# Patient Record
Sex: Female | Born: 1945 | Race: Black or African American | Hispanic: No | Smoking: Never smoker
Health system: Southern US, Community
[De-identification: ages and names within clinical notes are randomized; demographics above are authoritative.]

## PROBLEM LIST (undated history)

## (undated) DIAGNOSIS — E785 Hyperlipidemia, unspecified: Secondary | ICD-10-CM

## (undated) DIAGNOSIS — M1990 Unspecified osteoarthritis, unspecified site: Secondary | ICD-10-CM

## (undated) DIAGNOSIS — D72819 Decreased white blood cell count, unspecified: Principal | ICD-10-CM

## (undated) HISTORY — DX: Hyperlipidemia, unspecified: E78.5

## (undated) HISTORY — PX: COLONOSCOPY: SHX174

## (undated) HISTORY — DX: Decreased white blood cell count, unspecified: D72.819

---

## 2007-05-24 ENCOUNTER — Other Ambulatory Visit: Admission: RE | Admit: 2007-05-24 | Discharge: 2007-05-24 | Payer: Self-pay | Admitting: Radiology

## 2014-05-23 ENCOUNTER — Encounter: Payer: Self-pay | Admitting: Hematology and Oncology

## 2014-05-23 ENCOUNTER — Ambulatory Visit (HOSPITAL_BASED_OUTPATIENT_CLINIC_OR_DEPARTMENT_OTHER): Payer: Medicare Other | Admitting: Hematology and Oncology

## 2014-05-23 ENCOUNTER — Ambulatory Visit: Payer: Medicare Other

## 2014-05-23 ENCOUNTER — Telehealth: Payer: Self-pay | Admitting: Hematology and Oncology

## 2014-05-23 ENCOUNTER — Ambulatory Visit (HOSPITAL_BASED_OUTPATIENT_CLINIC_OR_DEPARTMENT_OTHER): Payer: Medicare Other

## 2014-05-23 VITALS — BP 146/75 | HR 97 | Temp 98.5°F | Resp 20 | Ht 66.0 in | Wt 136.9 lb

## 2014-05-23 DIAGNOSIS — D72819 Decreased white blood cell count, unspecified: Secondary | ICD-10-CM

## 2014-05-23 HISTORY — DX: Decreased white blood cell count, unspecified: D72.819

## 2014-05-23 LAB — CBC WITH DIFFERENTIAL/PLATELET
BASO%: 2.5 % — AB (ref 0.0–2.0)
Basophils Absolute: 0.1 10*3/uL (ref 0.0–0.1)
EOS%: 2.4 % (ref 0.0–7.0)
Eosinophils Absolute: 0.1 10*3/uL (ref 0.0–0.5)
HEMATOCRIT: 37.3 % (ref 34.8–46.6)
HGB: 12 g/dL (ref 11.6–15.9)
LYMPH%: 38.4 % (ref 14.0–49.7)
MCH: 28.5 pg (ref 25.1–34.0)
MCHC: 32.1 g/dL (ref 31.5–36.0)
MCV: 89 fL (ref 79.5–101.0)
MONO#: 0.4 10*3/uL (ref 0.1–0.9)
MONO%: 13.7 % (ref 0.0–14.0)
NEUT#: 1.2 10*3/uL — ABNORMAL LOW (ref 1.5–6.5)
NEUT%: 43 % (ref 38.4–76.8)
PLATELETS: 313 10*3/uL (ref 145–400)
RBC: 4.2 10*6/uL (ref 3.70–5.45)
RDW: 14.9 % — ABNORMAL HIGH (ref 11.2–14.5)
WBC: 2.7 10*3/uL — ABNORMAL LOW (ref 3.9–10.3)
lymph#: 1 10*3/uL (ref 0.9–3.3)

## 2014-05-23 LAB — VITAMIN B12: Vitamin B-12: 420 pg/mL (ref 211–911)

## 2014-05-23 LAB — MORPHOLOGY: PLT EST: ADEQUATE

## 2014-05-23 LAB — SEDIMENTATION RATE: Sed Rate: 1 mm/hr (ref 0–22)

## 2014-05-23 LAB — HIV ANTIBODY (ROUTINE TESTING W REFLEX): HIV: NONREACTIVE

## 2014-05-23 LAB — RHEUMATOID FACTOR: Rhuematoid fact SerPl-aCnc: <10 IU/mL (ref <=14)

## 2014-05-23 LAB — CHCC SMEAR

## 2014-05-23 NOTE — Progress Notes (Signed)
[facility] NOTE  Patient Care Team: [name], MD as PCP - General (Family Medicine) [name], MD as Referring Physician (Family Medicine) Heath Lark, MD as Consulting Physician (Hematology and Oncology)  CHIEF COMPLAINTS/PURPOSE OF CONSULTATION:  Chronic leukopenia  HISTORY OF PRESENTING ILLNESS:  Samantha Elliott 69 y.o. female is here because of low WBC.  She was found to have abnormal CBC from routine blood work. She brought with her some historical laboratory data.  On 09/25/1999, total cell count was 6.5.  On 01/08/2006, total white blood cell count was 4.4  On 06/01/2007, total white blood cell count was 3.8. In November 2014, total white blood cell count was 3.2 On 05/11/2014, total blood cell count was 2.9 She denies recent infection. The last prescription antibiotics was more than 3 months ago There were no reported symptoms of sinus congestion, cough, urinary frequency/urgency or dysuria, diarrhea, joint swelling/pain or abnormal skin rash. 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  She had no prior history or diagnosis of cancer. Her age appropriate screening programs are up-to-date. The patient has no prior diagnosis of autoimmune disease and was not prescribed corticosteroids related products.  MEDICAL HISTORY:  Past Medical History  Diagnosis Date  . Hyperlipidemia   . Leukopenia 05/23/2014    SURGICAL HISTORY: History reviewed. No pertinent past surgical history.  SOCIAL HISTORY: History   Social History  . Marital Status: Unknown    Spouse Name: N/A    Number of Children: N/A  . Years of Education: N/A   Occupational History  . Not on file.   Social History Main Topics  . Smoking status: Never Smoker   . Smokeless tobacco: Never Used  . Alcohol Use: No  . Drug Use: No  . Sexual Activity: Not on file   Other Topics Concern  . Not on file   Social History Narrative  . No  narrative on file    FAMILY HISTORY: Family History  Problem Relation Age of Onset  . Cancer Sister     breast & uterine ca    ALLERGIES:  has No Known Allergies.  MEDICATIONS:  Current Outpatient Prescriptions  Medication Sig Dispense Refill  . aspirin 81 MG tablet Take 81 mg by mouth daily.      . Calcium-Magnesium-Vitamin D (CALCIUM 500 PO) Take 500 mg by mouth daily. Gummy, 2 daily      . Cats Claw 1000 MG CAPS Take 500 mg by mouth daily.      . cholecalciferol (VITAMIN D) 1000 UNITS tablet Take 2,000 Units by mouth daily.      Marland Kitchen co-enzyme Q-10 30 MG capsule Take 100 mg by mouth 2 (two) times daily.      . fenofibrate (TRICOR) 145 MG tablet Take 145 mg by mouth daily.      . Multiple Vitamin (MULTIVITAMIN) tablet Take 2 tablets by mouth daily. Gummy      . niacin (NIASPAN) 500 MG CR tablet Take 500 mg by mouth daily.      Marland Kitchen omega-3 acid ethyl esters (LOVAZA) 1 G capsule Take by mouth daily.       No current facility-administered medications for this visit.    REVIEW OF SYSTEMS:   Constitutional: Denies fevers, chills or abnormal night sweats Eyes: Denies blurriness of vision, double vision or watery eyes Ears, nose, mouth, throat, and face: Denies mucositis or sore throat Respiratory: Denies cough, dyspnea or wheezes Cardiovascular: Denies palpitation, chest discomfort  or lower extremity swelling Gastrointestinal:  Denies nausea, heartburn or change in bowel habits Skin: Denies abnormal skin rashes Lymphatics: Denies new lymphadenopathy or easy bruising Neurological:Denies numbness, tingling or new weaknesses Behavioral/Psych: Mood is stable, no new changes  All other systems were reviewed with the patient and are negative.  PHYSICAL EXAMINATION: ECOG PERFORMANCE STATUS: 0 - Asymptomatic  Filed Vitals:   05/23/14 1106  BP: 146/75  Pulse: 97  Temp: 98.5 F (36.9 C)  Resp: 20   Filed Weights   05/23/14 1106  Weight: 136 lb 14.4 oz (62.097 kg)     GENERAL:alert, no distress and comfortable SKIN: skin color, texture, turgor are normal, no rashes or significant lesions EYES: normal, conjunctiva are pink and non-injected, sclera clear OROPHARYNX:no exudate, no erythema and lips, buccal mucosa, and tongue normal  NECK: supple, thyroid normal size, non-tender, without nodularity LYMPH:  no palpable lymphadenopathy in the cervical, axillary or inguinal LUNGS: clear to auscultation and percussion with normal breathing effort HEART: regular rate & rhythm and no murmurs and no lower extremity edema ABDOMEN:abdomen soft, non-tender and normal bowel sounds Musculoskeletal:no cyanosis of digits and no clubbing  PSYCH: alert & oriented x 3 with fluent speech NEURO: no focal motor/sensory deficits  LABORATORY DATA:  I have reviewed the data as listed Recent Results (from the past 2160 hour(s))  HIV ANTIBODY (ROUTINE TESTING)     Status: None   Collection Time    05/23/14 12:04 PM      Result Value Ref Range   HIV 1&2 Ab, 4th Generation NONREACTIVE  NONREACTIVE   Comment:  A NONREACTIVE HIV Ag/Ab result does not exclude HIV infection sincethe time frame for seroconversion is variable. If acute HIV infectionis suspected, a HIV-1 RNA Qualitative TMA test is recommended. HIV-1/2 Antibody Diff         Not indicated.HIV-1 RNA,      Qual TMA           Not indicated. PLEASE NOTE: This information has been disclosed to you from [place] confidentiality may be protected by state law. If your staterequires such protection, then the state law prohibits you from [facility] further      disclosure of the information without the specific writtenconsent of the person to whom it pertains, or as otherwise permittedby law. A general authorization for the release of medical or otherinformation is NOT sufficient for this purpose. The      performance of this assay has not been clinically validated inpatients less than 22 years old.  SEDIMENTATION RATE      Status: None   Collection Time    05/23/14 12:04 PM      Result Value Ref Range   Sed Rate 1  0 - 22 mm/hr  RHEUMATOID FACTOR     Status: None   Collection Time    05/23/14 12:04 PM      Result Value Ref Range   Rheumatoid Factor <10  <=14 IU/mL   Comment:                           Interpretive Table                    Low Positive: 15 - 41 IU/mL                    High Positive:  >= 42 IU/mL  In addition to the RF result, and clinical symptoms including joint involvement, the  2010 ACR Classification      Criteria for scoring/diagnosing Rheumatoid Arthritis include the results of the following tests:  CRP (56979), ESR (15010), and CCP (APCA) (48016). [email or web address].org/practice/clinical/classification/ra/ra_2010.asp  CBC WITH DIFFERENTIAL     Status: Abnormal   Collection Time    05/23/14 12:05 PM      Result Value Ref Range   WBC 2.7 (*) 3.9 - 10.3 10e3/uL   NEUT# 1.2 (*) 1.5 - 6.5 10e3/uL   HGB 12.0  11.6 - 15.9 g/dL   HCT 37.3  34.8 - 46.6 %   Platelets 313  145 - 400 10e3/uL   MCV 89.0  79.5 - 101.0 fL   MCH 28.5  25.1 - 34.0 pg   MCHC 32.1  31.5 - 36.0 g/dL   RBC 4.20  3.70 - 5.45 10e6/uL   RDW 14.9 (*) 11.2 - 14.5 %   lymph# 1.0  0.9 - 3.3 10e3/uL   MONO# 0.4  0.1 - 0.9 10e3/uL   Eosinophils Absolute 0.1  0.0 - 0.5 10e3/uL   Basophils Absolute 0.1  0.0 - 0.1 10e3/uL   NEUT% 43.0  38.4 - 76.8 %   LYMPH% 38.4  14.0 - 49.7 %   MONO% 13.7  0.0 - 14.0 %   EOS% 2.4  0.0 - 7.0 %   BASO% 2.5 (*) 0.0 - 2.0 %  [facility] SMEAR     Status: None   Collection Time    05/23/14 12:05 PM      Result Value Ref Range   Smear Result Smear Available    MORPHOLOGY     Status: None   Collection Time    05/23/14 12:05 PM      Result Value Ref Range   Ovalocytes Moderate  Negative   Helmet Cell Few  Negative   White Cell Comments C/W auto diff     PLT EST Adequate  Adequate   Platelet Morphology Large Platelets  Within Normal Limits    ASSESSMENT & PLAN Leukopenia Cause is unknown. I  recommend further workup to exclude autoimmune disease, infectious disease and nutritional deficiency. The patient is symptomatic. I will see her back next week for further assessment and plan.

## 2014-05-23 NOTE — Progress Notes (Signed)
Checked in new patient with no financial issues prior to seeing the dr. She has appt card and [place] been out of the country. °

## 2014-05-23 NOTE — Telephone Encounter (Signed)
gv adn printed appt sched and avs for pt for Aug...sent pt to lab °

## 2014-05-23 NOTE — Assessment & Plan Note (Signed)
Cause is unknown. I recommend further workup to exclude autoimmune disease, infectious disease and nutritional deficiency. The patient is symptomatic. I will see her back next week for further assessment and plan.

## 2014-05-29 ENCOUNTER — Encounter: Payer: Self-pay | Admitting: Hematology and Oncology

## 2014-05-29 ENCOUNTER — Telehealth: Payer: Self-pay | Admitting: *Deleted

## 2014-05-29 ENCOUNTER — Ambulatory Visit (HOSPITAL_BASED_OUTPATIENT_CLINIC_OR_DEPARTMENT_OTHER): Payer: Medicare Other | Admitting: Hematology and Oncology

## 2014-05-29 VITALS — BP 143/77 | HR 77 | Temp 98.4°F | Resp 18 | Ht 66.0 in | Wt 138.0 lb

## 2014-05-29 DIAGNOSIS — D72819 Decreased white blood cell count, unspecified: Secondary | ICD-10-CM

## 2014-05-29 NOTE — Telephone Encounter (Signed)
Notified patient of [facility] on 9/8 0700, Dr Bertis Ruddy 9/17 1100.

## 2014-05-29 NOTE — Assessment & Plan Note (Signed)
The cause is unknown. I discussed with her and her daughter over the telephone about the risks, benefits, alternatives to bone marrow aspirate and biopsy. I felt that she would need bone marrow biopsy in the near future due to progressive leukopenia. I discussed with them in great detail to procedure and she ultimately wants to proceed for biopsy under sedation.

## 2014-05-29 NOTE — Progress Notes (Signed)
[facility] OFFICE PROGRESS NOTE  Samantha Savage, MD SUMMARY OF HEMATOLOGIC HISTORY:  She was found to have abnormal CBC from routine blood work. She brought with her some historical laboratory data.  On 09/25/1999, total cell count was 6.5.  On 01/08/2006, total white blood cell count was 4.4  On 06/01/2007, total white blood cell count was 3.8. In November 2014, total white blood cell count was 3.2 On 05/11/2014, total blood cell count was 2.9 On 05/23/2014, repeat total white blood cell count is low at 2.7. All of vision no workup excluded possible nutritional deficiency, infection or autoimmune disease. INTERVAL HISTORY: Samantha Elliott 68 y.o. female returns for further followup. She is not symptomatic.  I have reviewed the past medical history, past surgical history, social history and family history with the patient and they are unchanged from previous note.  ALLERGIES:  has No Known Allergies.  MEDICATIONS:  Current Outpatient Prescriptions  Medication Sig Dispense Refill  . aspirin 81 MG tablet Take 81 mg by mouth daily.      . Calcium-Magnesium-Vitamin D (CALCIUM 500 PO) Take 500 mg by mouth daily. Gummy, 2 daily      . Cats Claw 1000 MG CAPS Take 500 mg by mouth daily.      . cholecalciferol (VITAMIN D) 1000 UNITS tablet Take 2,000 Units by mouth daily.      Marland Kitchen co-enzyme Q-10 30 MG capsule Take 100 mg by mouth 2 (two) times daily.      . fenofibrate (TRICOR) 145 MG tablet Take 145 mg by mouth daily.      . Multiple Vitamin (MULTIVITAMIN) tablet Take 2 tablets by mouth daily. Gummy      . omega-3 acid ethyl esters (LOVAZA) 1 G capsule Take by mouth daily.      . niacin (NIASPAN) 500 MG CR tablet Take 500 mg by mouth daily.       No current facility-administered medications for this visit.     REVIEW OF SYSTEMS:   Constitutional: Denies fevers, chills or night sweats Eyes: Denies blurriness of vision Ears, nose, mouth, throat, and face: Denies mucositis or sore  throat Respiratory: Denies cough, dyspnea or wheezes Cardiovascular: Denies palpitation, chest discomfort or lower extremity swelling Gastrointestinal:  Denies nausea, heartburn or change in bowel habits Skin: Denies abnormal skin rashes Lymphatics: Denies new lymphadenopathy or easy bruising Neurological:Denies numbness, tingling or new weaknesses Behavioral/Psych: Mood is stable, no new changes  All other systems were reviewed with the patient and are negative.  PHYSICAL EXAMINATION: ECOG PERFORMANCE STATUS: 0 - Asymptomatic  Filed Vitals:   05/29/14 0944  BP: 143/77  Pulse: 77  Temp: 98.4 F (36.9 C)  Resp: 18   Filed Weights   05/29/14 0944  Weight: 138 lb (62.596 kg)    GENERAL:alert, no distress and comfortable SKIN: skin color, texture, turgor are normal, no rashes or significant lesions EYES: normal, Conjunctiva are pink and non-injected, sclera clear Musculoskeletal:no cyanosis of digits and no clubbing  NEURO: alert & oriented x 3 with fluent speech, no focal motor/sensory deficits  LABORATORY DATA:  I have reviewed the data as listed No results found for this or any previous visit (from the past 48 hour(s)).  Lab Results  Component Value Date   WBC 2.7* 05/23/2014   HGB 12.0 05/23/2014   HCT 37.3 05/23/2014   MCV 89.0 05/23/2014   PLT 313 05/23/2014    ASSESSMENT & PLAN:  Leukopenia The cause is unknown. I discussed with her and her  daughter over the telephone about the risks, benefits, alternatives to bone marrow aspirate and biopsy. I felt that she would need bone marrow biopsy in the near future due to progressive leukopenia. I discussed with them in great detail to procedure and she ultimately wants to proceed for biopsy under sedation.    All questions were answered. The patient knows to call the clinic with any problems, questions or concerns. No barriers to learning was detected.  I spent 25 minutes counseling the patient face to face. The total time  spent in the appointment was 30 minutes and more than 50% was on counseling.     [facility], [place], MD 05/29/2014 4:39 PM

## 2014-05-30 ENCOUNTER — Telehealth: Payer: Self-pay | Admitting: Hematology and Oncology

## 2014-05-30 NOTE — Telephone Encounter (Signed)
gv and printed appt schedand avs for pt for Sept °

## 2014-06-01 ENCOUNTER — Encounter (HOSPITAL_COMMUNITY): Payer: Self-pay | Admitting: Pharmacy Technician

## 2014-06-06 ENCOUNTER — Ambulatory Visit (HOSPITAL_COMMUNITY)
Admission: RE | Admit: 2014-06-06 | Discharge: 2014-06-06 | Disposition: A | Discharge status: Home / Self Care | Payer: Medicare Other | Source: Ambulatory Visit | Attending: Hematology and Oncology | Admitting: Hematology and Oncology

## 2014-06-06 ENCOUNTER — Encounter (HOSPITAL_COMMUNITY): Payer: Self-pay

## 2014-06-06 VITALS — BP 144/72 | HR 90 | Temp 98.1°F | Resp 18 | Ht 66.0 in | Wt 138.0 lb

## 2014-06-06 DIAGNOSIS — D72819 Decreased white blood cell count, unspecified: Secondary | ICD-10-CM | POA: Diagnosis not present

## 2014-06-06 LAB — CBC WITH DIFFERENTIAL/PLATELET
Basophils Absolute: 0 10*3/uL (ref 0.0–0.1)
Basophils Relative: 1 % (ref 0–1)
EOS PCT: 2 % (ref 0–5)
Eosinophils Absolute: 0.1 10*3/uL (ref 0.0–0.7)
HCT: 39.2 % (ref 36.0–46.0)
HEMOGLOBIN: 12.8 g/dL (ref 12.0–15.0)
LYMPHS ABS: 1.5 10*3/uL (ref 0.7–4.0)
LYMPHS PCT: 33 % (ref 12–46)
MCH: 29.2 pg (ref 26.0–34.0)
MCHC: 32.7 g/dL (ref 30.0–36.0)
MCV: 89.3 fL (ref 78.0–100.0)
MONOS PCT: 9 % (ref 3–12)
Monocytes Absolute: 0.4 10*3/uL (ref 0.1–1.0)
NEUTROS PCT: 55 % (ref 43–77)
Neutro Abs: 2.5 10*3/uL (ref 1.7–7.7)
PLATELETS: 353 10*3/uL (ref 150–400)
RBC: 4.39 MIL/uL (ref 3.87–5.11)
RDW: 14.9 % (ref 11.5–15.5)
WBC: 4.6 10*3/uL (ref 4.0–10.5)

## 2014-06-06 MED ORDER — SODIUM CHLORIDE 0.9 % IV SOLN
INTRAVENOUS | Status: DC
Start: 2014-06-06 — End: 2014-06-07
  Administered 2014-06-06: 07:00:00 via INTRAVENOUS

## 2014-06-06 MED ORDER — MIDAZOLAM HCL 10 MG/2ML IJ SOLN
10.0000 mg | Freq: Once | INTRAMUSCULAR | Status: DC
  Filled 2014-06-06: qty 2

## 2014-06-06 MED ORDER — FENTANYL CITRATE 0.05 MG/ML IJ SOLN
100.0000 ug | Freq: Once | INTRAMUSCULAR | Status: DC
  Filled 2014-06-06: qty 2

## 2014-06-06 NOTE — Discharge Instructions (Signed)
Bone Marrow Aspiration, Bone Marrow Biopsy °Care After °Read the instructions outlined below and refer to this sheet in the next few weeks. These discharge instructions provide you with general information on caring for yourself after you leave the hospital. Your caregiver may also give you specific instructions. While your treatment has been planned according to the most current medical practices available, unavoidable complications occasionally occur. If you have any problems or questions after discharge, call your caregiver. °FINDING OUT THE RESULTS OF YOUR TEST °Not all test results are available during your visit. If your test results are not back during the visit, make an appointment with your caregiver to find out the results. Do not assume everything is normal if you have not heard from your caregiver or the medical facility. It is important for you to follow up on all of your test results.  °HOME CARE INSTRUCTIONS  °You have had sedation and may be sleepy or dizzy. Your thinking may not be as clear as usual. For the next 24 hours: °· Only take over-the-counter or prescription medicines for pain, discomfort, and or fever as directed by your caregiver. °· Do not drink alcohol. °· Do not smoke. °· Do not drive. °· Do not make important legal decisions. °· Do not operate heavy machinery. °· Do not care for small children by yourself. °· Keep your dressing clean and dry. You may replace dressing with a bandage after 24 hours. °· You may take a bath or shower after 24 hours. °· Use an ice pack for 20 minutes every 2 hours while awake for pain as needed. °SEEK MEDICAL CARE IF:  °· There is redness, swelling, or increasing pain at the biopsy site. °· There is pus coming from the biopsy site. °· There is drainage from a biopsy site lasting longer than one day. °· An unexplained oral temperature above 102° F (38.9° C) develops. °SEEK IMMEDIATE MEDICAL CARE IF:  °· You develop a rash. °· You have difficulty  breathing. °· You develop any reaction or side effects to medications given. °Document Released: 04/04/2005 Document Revised: 12/08/2011 Document Reviewed: 09/12/2008 °[employer]® Patient Information ©2015 [employer]. This information is not intended to replace advice given to you by your health care provider. Make sure you discuss any questions you have with your health care provider. °Conscious Sedation, Adult, Care After °Refer to this sheet in the next few weeks. These instructions provide you with information on caring for yourself after your procedure. Your health care provider may also give you more specific instructions. Your treatment has been planned according to current medical practices, but problems sometimes occur. Call your health care provider if you have any problems or questions after your procedure. °WHAT TO EXPECT AFTER THE PROCEDURE  °After your procedure: °· You may feel sleepy, clumsy, and have poor balance for several hours. °· Vomiting may occur if you eat too soon after the procedure. °HOME CARE INSTRUCTIONS °· Do not participate in any activities where you could become injured for at least 24 hours. Do not: °¨ Drive. °¨ Swim. °¨ Ride a bicycle. °¨ Operate heavy machinery. °¨ Cook. °¨ Use power tools. °¨ Climb ladders. °¨ Work from a high place. °· Do not make important decisions or sign legal documents until you are improved. °· If you vomit, drink water, juice, or soup when you can drink without vomiting. Make sure you have little or no nausea before eating solid foods. °· Only take over-the-counter or prescription medicines for pain, discomfort, or fever   as directed by your health care provider.  Make sure you and your family fully understand everything about the medicines given to you, including what side effects may occur.  You should not drink alcohol, take sleeping pills, or take medicines that cause drowsiness for at least 24 hours.  If you smoke, do not smoke without  supervision.  If you are feeling better, you may resume normal activities 24 hours after you were sedated.  Keep all appointments with your health care provider. SEEK MEDICAL CARE IF:  Your skin is pale or bluish in color.  You continue to feel nauseous or vomit.  Your pain is getting worse and is not helped by medicine.  You have bleeding or swelling.  You are still sleepy or feeling clumsy after 24 hours. SEEK IMMEDIATE MEDICAL CARE IF:  You develop a rash.  You have difficulty breathing.  You develop any type of allergic problem.  You have a fever. MAKE SURE YOU:  Understand these instructions.  Will watch your condition.  Will get help right away if you are not doing well or get worse. Document Released: 07/06/2013 Document Reviewed: 07/06/2013 [facility] Patient Information 2015 [place], [place]. This information is not intended to replace advice given to you by your health care provider. Make sure you discuss any questions you have with your health care provider.

## 2014-06-06 NOTE — Progress Notes (Signed)
I reviewed the CBC with the patient and her daughter. The total white blood cell count is now normal. There is no benefit to proceed with bone marrow aspirate and biopsy. I have cancelled the procedure and I recommend routine PCP followup. 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 I will cancelled her return followup.

## 2014-06-15 ENCOUNTER — Ambulatory Visit: Payer: Medicare Other | Admitting: Hematology and Oncology

## 2021-11-19 ENCOUNTER — Other Ambulatory Visit: Payer: Self-pay | Admitting: Family Medicine

## 2021-11-28 ENCOUNTER — Encounter (HOSPITAL_BASED_OUTPATIENT_CLINIC_OR_DEPARTMENT_OTHER): Payer: Self-pay | Admitting: Otolaryngology

## 2021-11-28 ENCOUNTER — Other Ambulatory Visit: Payer: Self-pay

## 2021-12-09 ENCOUNTER — Other Ambulatory Visit: Payer: Self-pay

## 2021-12-09 ENCOUNTER — Encounter (HOSPITAL_BASED_OUTPATIENT_CLINIC_OR_DEPARTMENT_OTHER): Payer: Self-pay | Admitting: Otolaryngology

## 2021-12-09 ENCOUNTER — Ambulatory Visit (HOSPITAL_BASED_OUTPATIENT_CLINIC_OR_DEPARTMENT_OTHER)
Admission: RE | Admit: 2021-12-09 | Discharge: 2021-12-10 | Disposition: A | Discharge status: Home / Self Care | Payer: Medicare PPO | Attending: Otolaryngology | Admitting: Otolaryngology

## 2021-12-09 ENCOUNTER — Ambulatory Visit (HOSPITAL_BASED_OUTPATIENT_CLINIC_OR_DEPARTMENT_OTHER): Payer: Medicare PPO | Admitting: Certified Registered"

## 2021-12-09 ENCOUNTER — Encounter (HOSPITAL_BASED_OUTPATIENT_CLINIC_OR_DEPARTMENT_OTHER)
Admission: RE | Disposition: A | Discharge status: Home / Self Care | Payer: Self-pay | Source: Home / Self Care | Attending: Otolaryngology

## 2021-12-09 DIAGNOSIS — E041 Nontoxic single thyroid nodule: Secondary | ICD-10-CM | POA: Insufficient documentation

## 2021-12-09 DIAGNOSIS — Z9049 Acquired absence of other specified parts of digestive tract: Secondary | ICD-10-CM | POA: Diagnosis present

## 2021-12-09 DIAGNOSIS — K118 Other diseases of salivary glands: Secondary | ICD-10-CM

## 2021-12-09 SURGERY — EXCISION, PAROTID GLAND
Anesthesia: General | Site: Face | Laterality: Right

## 2021-12-09 MED ORDER — PROPOFOL 10 MG/ML IV BOLUS
INTRAVENOUS | Status: DC | PRN
  Administered 2021-12-09: 20 mg via INTRAVENOUS
  Administered 2021-12-09: 100 mg via INTRAVENOUS
  Administered 2021-12-09 (×2): 20 mg via INTRAVENOUS

## 2021-12-09 MED ORDER — SUCCINYLCHOLINE CHLORIDE 200 MG/10ML IV SOSY
PREFILLED_SYRINGE | INTRAVENOUS | Status: DC | PRN
Start: 2021-12-09 — End: 2021-12-09
  Administered 2021-12-09: 100 mg via INTRAVENOUS

## 2021-12-09 MED ORDER — LIDOCAINE 2% (20 MG/ML) 5 ML SYRINGE
INTRAMUSCULAR | Status: AC
  Filled 2021-12-09: qty 5

## 2021-12-09 MED ORDER — FENTANYL CITRATE (PF) 100 MCG/2ML IJ SOLN
INTRAMUSCULAR | Status: AC
  Filled 2021-12-09: qty 2

## 2021-12-09 MED ORDER — PHENYLEPHRINE HCL (PRESSORS) 10 MG/ML IV SOLN
INTRAVENOUS | Status: AC
  Filled 2021-12-09: qty 1

## 2021-12-09 MED ORDER — ONDANSETRON HCL 4 MG/2ML IJ SOLN
INTRAMUSCULAR | Status: DC | PRN
  Administered 2021-12-09: 4 mg via INTRAVENOUS

## 2021-12-09 MED ORDER — ONDANSETRON HCL 4 MG/2ML IJ SOLN
4.0000 mg | INTRAMUSCULAR | Status: DC | PRN
  Administered 2021-12-09 (×2): 4 mg via INTRAVENOUS
  Filled 2021-12-09 (×2): qty 2

## 2021-12-09 MED ORDER — CEFAZOLIN SODIUM-DEXTROSE 2-3 GM-%(50ML) IV SOLR
INTRAVENOUS | Status: DC | PRN
  Administered 2021-12-09: 2 g via INTRAVENOUS

## 2021-12-09 MED ORDER — PHENYLEPHRINE 40 MCG/ML (10ML) SYRINGE FOR IV PUSH (FOR BLOOD PRESSURE SUPPORT)
PREFILLED_SYRINGE | INTRAVENOUS | Status: AC
  Filled 2021-12-09: qty 10

## 2021-12-09 MED ORDER — LACTATED RINGERS IV SOLN: INTRAVENOUS | Status: DC

## 2021-12-09 MED ORDER — ACETAMINOPHEN 500 MG PO TABS
1000.0000 mg | ORAL_TABLET | Freq: Once | ORAL | Status: AC
  Administered 2021-12-09: 1000 mg via ORAL

## 2021-12-09 MED ORDER — DEXAMETHASONE SODIUM PHOSPHATE 4 MG/ML IJ SOLN
INTRAMUSCULAR | Status: DC | PRN
  Administered 2021-12-09: 8 mg via INTRAVENOUS

## 2021-12-09 MED ORDER — FENTANYL CITRATE (PF) 100 MCG/2ML IJ SOLN: 25.0000 ug | INTRAMUSCULAR | Status: DC | PRN

## 2021-12-09 MED ORDER — ONDANSETRON HCL 4 MG/2ML IJ SOLN
INTRAMUSCULAR | Status: AC
  Filled 2021-12-09: qty 2

## 2021-12-09 MED ORDER — OXYCODONE-ACETAMINOPHEN 5-325 MG PO TABS
1.0000 | ORAL_TABLET | ORAL | Status: DC | PRN
  Administered 2021-12-09: 1 via ORAL
  Filled 2021-12-09: qty 2

## 2021-12-09 MED ORDER — KCL IN DEXTROSE-NACL 20-5-0.45 MEQ/L-%-% IV SOLN
INTRAVENOUS | Status: DC
  Filled 2021-12-09: qty 1000

## 2021-12-09 MED ORDER — LIDOCAINE-EPINEPHRINE 1 %-1:100000 IJ SOLN
INTRAMUSCULAR | Status: AC
  Filled 2021-12-09: qty 1

## 2021-12-09 MED ORDER — PROPOFOL 10 MG/ML IV BOLUS
INTRAVENOUS | Status: AC
  Filled 2021-12-09: qty 20

## 2021-12-09 MED ORDER — ZOLPIDEM TARTRATE 5 MG PO TABS: 5.0000 mg | ORAL_TABLET | Freq: Every evening | ORAL | Status: DC | PRN

## 2021-12-09 MED ORDER — CEFAZOLIN SODIUM-DEXTROSE 2-4 GM/100ML-% IV SOLN
INTRAVENOUS | Status: AC
  Filled 2021-12-09: qty 100

## 2021-12-09 MED ORDER — PHENYLEPHRINE HCL (PRESSORS) 10 MG/ML IV SOLN
INTRAVENOUS | Status: DC | PRN
  Administered 2021-12-09 (×2): 80 ug via INTRAVENOUS

## 2021-12-09 MED ORDER — ONDANSETRON HCL 4 MG PO TABS: 4.0000 mg | ORAL_TABLET | ORAL | Status: DC | PRN

## 2021-12-09 MED ORDER — ACETAMINOPHEN 500 MG PO TABS
ORAL_TABLET | ORAL | Status: AC
  Filled 2021-12-09: qty 2

## 2021-12-09 MED ORDER — PHENYLEPHRINE HCL-NACL 20-0.9 MG/250ML-% IV SOLN
INTRAVENOUS | Status: DC | PRN
  Administered 2021-12-09: 50 ug/min via INTRAVENOUS

## 2021-12-09 MED ORDER — FENTANYL CITRATE (PF) 100 MCG/2ML IJ SOLN
INTRAMUSCULAR | Status: DC | PRN
Start: 2021-12-09 — End: 2021-12-09
  Administered 2021-12-09: 50 ug via INTRAVENOUS
  Administered 2021-12-09: 25 ug via INTRAVENOUS
  Administered 2021-12-09: 50 ug via INTRAVENOUS

## 2021-12-09 MED ORDER — LIDOCAINE HCL (CARDIAC) PF 100 MG/5ML IV SOSY
PREFILLED_SYRINGE | INTRAVENOUS | Status: DC | PRN
Start: 2021-12-09 — End: 2021-12-09
  Administered 2021-12-09: 60 mg via INTRAVENOUS

## 2021-12-09 MED ORDER — MORPHINE SULFATE (PF) 4 MG/ML IV SOLN: 2.0000 mg | INTRAVENOUS | Status: DC | PRN

## 2021-12-09 MED ORDER — LIDOCAINE-EPINEPHRINE 1 %-1:100000 IJ SOLN
INTRAMUSCULAR | Status: DC | PRN
  Administered 2021-12-09: 4 mL

## 2021-12-09 SURGICAL SUPPLY — 57 items
APPLICATOR DR MATTHEWS STRL (MISCELLANEOUS) ×2 IMPLANT
ATTRACTOMAT 16X20 MAGNETIC DRP (DRAPES) IMPLANT
BLADE SURG 15 STRL LF DISP TIS (BLADE) ×1 IMPLANT
BLADE SURG 15 STRL SS (BLADE) ×1
CANISTER SUCT 1200ML W/VALVE (MISCELLANEOUS) ×2 IMPLANT
CORD BIPOLAR FORCEPS 12FT (ELECTRODE) ×2 IMPLANT
COTTONBALL LRG STERILE PKG (GAUZE/BANDAGES/DRESSINGS) ×2 IMPLANT
COVER BACK TABLE 60X90IN (DRAPES) ×2 IMPLANT
COVER MAYO STAND STRL (DRAPES) ×2 IMPLANT
DERMABOND ADVANCED (GAUZE/BANDAGES/DRESSINGS) ×1
DERMABOND ADVANCED .7 DNX12 (GAUZE/BANDAGES/DRESSINGS) ×1 IMPLANT
DRAIN CHANNEL 10F 3/8 F FF (DRAIN) ×1 IMPLANT
DRAPE SURG 17X23 STRL (DRAPES) IMPLANT
DRAPE U-SHAPE 76X120 STRL (DRAPES) ×2 IMPLANT
ELECT COATED BLADE 2.86 ST (ELECTRODE) ×2 IMPLANT
ELECT PAIRED SUBDERMAL (MISCELLANEOUS) ×2
ELECT REM PT RETURN 9FT ADLT (ELECTROSURGICAL) ×2
ELECTRODE PAIRED SUBDERMAL (MISCELLANEOUS) ×1 IMPLANT
ELECTRODE REM PT RTRN 9FT ADLT (ELECTROSURGICAL) ×1 IMPLANT
EVACUATOR SILICONE 100CC (DRAIN) ×1 IMPLANT
FORCEPS BIPOLAR SPETZLER 8 1.0 (NEUROSURGERY SUPPLIES) ×2 IMPLANT
GAUZE 4X4 16PLY ~~LOC~~+RFID DBL (SPONGE) ×3 IMPLANT
GLOVE SURG ENC MOIS LTX SZ6.5 (GLOVE) IMPLANT
GLOVE SURG ENC MOIS LTX SZ7.5 (GLOVE) ×2 IMPLANT
GLOVE SURG POLYISO LF SZ6.5 (GLOVE) ×1 IMPLANT
GLOVE SURG UNDER POLY LF SZ7 (GLOVE) ×2 IMPLANT
GOWN STRL REUS W/ TWL LRG LVL3 (GOWN DISPOSABLE) ×2 IMPLANT
GOWN STRL REUS W/TWL LRG LVL3 (GOWN DISPOSABLE) ×3
LOCATOR NERVE 3 VOLT (DISPOSABLE) IMPLANT
NDL HYPO 25X1 1.5 SAFETY (NEEDLE) ×1 IMPLANT
NEEDLE HYPO 25X1 1.5 SAFETY (NEEDLE) ×2 IMPLANT
NS IRRIG 1000ML POUR BTL (IV SOLUTION) ×2 IMPLANT
PACK BASIN DAY SURGERY FS (CUSTOM PROCEDURE TRAY) ×2 IMPLANT
PAD ALCOHOL SWAB (MISCELLANEOUS) ×4 IMPLANT
PENCIL SMOKE EVACUATOR (MISCELLANEOUS) ×2 IMPLANT
PIN SAFETY STERILE (MISCELLANEOUS) IMPLANT
PROBE NERVBE PRASS .33 (MISCELLANEOUS) ×2 IMPLANT
SHEARS HARMONIC 9CM CVD (BLADE) ×2 IMPLANT
SLEEVE SCD COMPRESS KNEE MED (STOCKING) ×2 IMPLANT
SPIKE FLUID TRANSFER (MISCELLANEOUS) ×1 IMPLANT
SPONGE INTESTINAL PEANUT (DISPOSABLE) ×4 IMPLANT
STAPLER VISISTAT 35W (STAPLE) IMPLANT
SUT ETHILON 3 0 PS 1 (SUTURE) ×1 IMPLANT
SUT PROLENE 5 0 PS 2 (SUTURE) ×2 IMPLANT
SUT SILK 2 0 PERMA HAND 18 BK (SUTURE) ×6 IMPLANT
SUT SILK 2 0 TIES 17X18 (SUTURE)
SUT SILK 2-0 18XBRD TIE BLK (SUTURE) IMPLANT
SUT SILK 3 0 TIES 17X18 (SUTURE) ×1
SUT SILK 3-0 18XBRD TIE BLK (SUTURE) ×1 IMPLANT
SUT SILK 4 0 TIES 17X18 (SUTURE) IMPLANT
SUT VIC AB 3-0 FS2 27 (SUTURE) IMPLANT
SUT VICRYL 4-0 PS2 18IN ABS (SUTURE) ×3 IMPLANT
SYR BULB EAR ULCER 3OZ GRN STR (SYRINGE) ×2 IMPLANT
SYR CONTROL 10ML LL (SYRINGE) ×2 IMPLANT
TOWEL GREEN STERILE FF (TOWEL DISPOSABLE) ×3 IMPLANT
TRAY DSU PREP LF (CUSTOM PROCEDURE TRAY) ×2 IMPLANT
TUBE CONNECTING 20X1/4 (TUBING) ×2 IMPLANT

## 2021-12-09 NOTE — Transfer of Care (Signed)
Immediate Anesthesia Transfer of Care Note ? ?Patient: Samantha Elliott ? ?Procedure(s) Performed: RIGHT TOTAL PAROTIDECTOMY (Right: Face) ? ?Patient Location: PACU ? ?Anesthesia Type:General ? ?Level of Consciousness: drowsy ? ?Airway & Oxygen Therapy: Patient Spontanous Breathing and Patient connected to face mask oxygen ? ?Post-op Assessment: Report given to RN and Post -op Vital signs reviewed and stable ? ?Post vital signs: Reviewed and stable ? ?Last Vitals:  ?Vitals Value Taken Time  ?BP 146/112 12/09/21 1130  ?Temp    ?Pulse 94 12/09/21 1132  ?Resp 24 12/09/21 1132  ?SpO2 95 % 12/09/21 1132  ?Vitals shown include unvalidated device data. ? ?Last Pain:  ?Vitals:  ? 12/09/21 0724  ?TempSrc: Oral  ?PainSc: 0-No pain  ?   ? ?  ? ?Complications: No notable events documented. ?

## 2021-12-09 NOTE — Anesthesia Preprocedure Evaluation (Addendum)
Anesthesia Evaluation  ?Patient identified by MRN, date of birth, ID band ?Patient awake ? ? ? ?Reviewed: ?Allergy & Precautions, H&P , NPO status , Patient's Chart, lab work & pertinent test results ? ?Airway ?Mallampati: II ? ?TM Distance: >3 FB ?Neck ROM: Full ? ? ? Dental ?no notable dental hx. ?(+) Teeth Intact, Dental Advisory Given ?  ?Pulmonary ?neg pulmonary ROS,  ?  ?Pulmonary exam normal ?breath sounds clear to auscultation ? ? ? ? ? ? Cardiovascular ?negative cardio ROS ? ? ?Rhythm:Regular Rate:Normal ? ? ?  ?Neuro/Psych ?negative neurological ROS ? negative psych ROS  ? GI/Hepatic ?negative GI ROS, Neg liver ROS,   ?Endo/Other  ?negative endocrine ROS ? Renal/GU ?negative Renal ROS  ?negative genitourinary ?  ?Musculoskeletal ? ? Abdominal ?  ?Peds ? Hematology ?negative hematology ROS ?(+)   ?Anesthesia Other Findings ? ? Reproductive/Obstetrics ?negative OB ROS ? ?  ? ? ? ? ? ? ? ? ? ? ? ? ? ?  ?  ? ? ? ? ? ? ? ?Anesthesia Physical ?Anesthesia Plan ? ?ASA: 2 ? ?Anesthesia Plan: General  ? ?Post-op Pain Management: Tylenol PO (pre-op)*  ? ?Induction: Intravenous ? ?PONV Risk Score and Plan: 4 or greater and Ondansetron, Dexamethasone and Treatment may vary due to age or medical condition ? ?Airway Management Planned: Oral ETT ? ?Additional Equipment:  ? ?Intra-op Plan:  ? ?Post-operative Plan: Extubation in OR ? ?Informed Consent: I have reviewed the patients History and Physical, chart, labs and discussed the procedure including the risks, benefits and alternatives for the proposed anesthesia with the patient or authorized representative who has indicated his/her understanding and acceptance.  ? ? ? ?Dental advisory given ? ?Plan Discussed with: CRNA ? ?Anesthesia Plan Comments:   ? ? ? ? ? ? ?Anesthesia Quick Evaluation ? ?

## 2021-12-09 NOTE — Anesthesia Postprocedure Evaluation (Signed)
Anesthesia Post Note ? ?Patient: Malgorzata Albert ? ?Procedure(s) Performed: RIGHT TOTAL PAROTIDECTOMY (Right: Face) ? ?  ? ?Patient location during evaluation: PACU ?Anesthesia Type: General ?Level of consciousness: awake and alert ?Pain management: pain level controlled ?Vital Signs Assessment: post-procedure vital signs reviewed and stable ?Respiratory status: spontaneous breathing, nonlabored ventilation and respiratory function stable ?Cardiovascular status: blood pressure returned to baseline and stable ?Postop Assessment: no apparent nausea or vomiting ?Anesthetic complications: no ? ? ?No notable events documented. ? ?Last Vitals:  ?Vitals:  ? 12/09/21 1145 12/09/21 1200  ?BP: 127/75 131/70  ?Pulse: 88 89  ?Resp: 10 14  ?Temp:    ?SpO2: 94% 94%  ?  ?Last Pain:  ?Vitals:  ? 12/09/21 1200  ?TempSrc:   ?PainSc: 0-No pain  ? ? ?  ?  ?  ?  ?  ?  ? ?Chritopher Coster,W. EDMOND ? ? ? ? ?

## 2021-12-09 NOTE — Op Note (Signed)
DATE OF PROCEDURE:  12/09/2021 ? ?                            OPERATIVE REPORT ? ?SURGEON:  Leta Baptist, MD ? ?PREOPERATIVE DIAGNOSES: ?1.  Right deep lobe parotid mass. ?  ?POSTOPERATIVE DIAGNOSES: ?1.  Right deep lobe parotid mass ?  ?PROCEDURE PERFORMED: Right total parotidectomy. ?  ?ANESTHESIA:  General endotracheal tube anesthesia. ?  ?COMPLICATIONS:  None. ?  ?ESTIMATED BLOOD LOSS: 20 ml ?  ?INDICATION FOR PROCEDURE: Samantha Elliott is a 76 y.o. female with a history of a right parotid mass. Previous CT scan showed a 2.1 cm well-circumscribed ovoid soft tissue mass within the deep lobe of the right parotid gland. According to the patient, she has noted slight numbness on the right side of her face. She denies any facial weakness. Based on the above findings, the decision was made for the patient to undergo the above stated procedure. Likelihood of success in reducing symptoms was also discussed.  The risks, benefits, alternatives, and details of the procedure were discussed with the patient.  Questions were invited and answered.  Informed consent was obtained. ?  ?DESCRIPTION:  The patient was taken to the operating room and placed supine on the operating table.  General endotracheal tube anesthesia was administered by the anesthesiologist.  Facial nerve monitoring electrodes were placed.  The facial nerve monitoring system was functional throughout the case. ?  ?The patient was positioned and prepped and draped in the standard fashion for right parotid surgery.  1% lidocaine with 1 100,000 epinephrine was infiltrated at the planned site of incision.  A standard facelift incision was made.  The right superficial SMAS skin flap was elevated in the standard fashion. ?  ?Careful dissection was performed along the right preauricular plane.  The dissection was carried down to the level of the facial nerve.  The main trunk of the facial nerve and the branches of the facial nerve were carefully dissected free.  All  branches were noted to be functional throughout the case.  At this time, a nearly 2 cm soft tissue mass was noted within the deep lobe of the parotid gland, beneath the facial nerve.  The facial nerve was mobilized, in order to allow access to the deep parotid lobe.  The entire parotid mass was then carefully dissected free from the surrounding soft tissue.  The mass was sent to the pathology department for permanent histologic identification. ?  ?The surgical site was copiously irrigated. A #10 JP drain was placed. The incision was closed in layers with 4-0 Vicryl and Dermabond. ?  ?The care of the patient was turned over to the anesthesiologist.  The patient was awakened from anesthesia without difficulty.  The patient was extubated and transferred to the recovery room in good condition. ?  ?OPERATIVE FINDINGS:  A 2 cm mass was noted within the deep right parotid lobe. ?  ?SPECIMEN: Right deep lobe parotid mass. ?  ?FOLLOWUP CARE:  The patient will be admitted for overnight observation. ? ?Samantha Elliott ?12/09/2021 11:15 AM  ?

## 2021-12-09 NOTE — Discharge Instructions (Signed)
Parotidectomy, Care After ?This sheet gives you information about how to care for yourself after your procedure. Your health care provider may also give you more specific instructions. If you have problems or questions, contact your health care provider. ?What can I expect after the procedure? ?After the procedure, it is common to have: ?Pain and mild swelling at the incision site. ?Numbness along the incision. ?Numbness in part or all of your ear. ?Mild jaw discomfort on the surgical side when you are eating or chewing. This may last up to 2-4 weeks. ?Follow these instructions at home: ?Medicines ?Take over-the-counter and prescription medicines only as told by your health care provider. ?If you were prescribed an antibiotic medicine, take it as told by your health care provider. Do not stop taking the antibiotic even if you start to feel better. ?Ask your health care provider if the medicine prescribed to you: ?Requires you to avoid driving or using heavy machinery. ?Can cause constipation. You may need to take actions to prevent or treat constipation, such as: ?Drink enough fluid to keep your urine pale yellow. ?Take over-the-counter or prescription medicines. ?Eat foods that are high in fiber, such as beans, whole grains, and fresh fruits and vegetables. ?Limit foods that are high in fat and processed sugars, such as fried or sweet foods. ?Incision care ?Follow instructions from your health care provider about how to take care of your incision. Make sure you: ?Leave stitches (sutures), skin glue, or adhesive strips in place. These skin closures may need to be in place for 2 weeks or longer. If adhesive strip edges start to loosen and curl up, you may trim the loose edges. Do not remove adhesive strips completely unless your health care provider tells you to do that. ?Check your incision area every day for signs of infection. Check for: ?More redness, swelling, or pain. ?Fluid or blood. ?Warmth. ?Pus or a bad  smell. ?Follow your health care provider's instructions about cleaning and maintaining the drain that was placed near your incision. ?Eating and drinking ?Follow instructions from your health care provider about eating or drinking restrictions. ?If your mouth or jaw is sore, try eating soft foods until you feel better. ?Activity ?Return to your normal activities as told by your health care provider. Ask your health care provider what activities are safe for you. ?Rest as told by your health care provider. ?Avoid sitting for a long time without moving. Get up to take short walks every 1-2 hours. This is important to improve blood flow and breathing. Ask for help if you feel weak or unsteady. ?Do not lift anything that is heavier than 20 lb, or the limit that you are told, until your health care provider says that it is safe. ?General instructions ?Keep your head raised (elevated) when you lie down during the first few weeks after surgery. This will help prevent increased swelling. ?Keep all follow-up visits as told by your health care provider. This is important. ?Contact a health care provider if: ?You have pain that does not get better with medicine. ?You have more redness, swelling, or pain around your incision. ?You have fluid or blood coming from your incision. ?Your incision feels warm to the touch. ?You have pus or a bad smell coming from your incision. ?You vomit or feel nauseous. ?You have a fever. ?Get help right away if: ?You have more pain, swelling, or redness that suddenly gets worse at the incision site. ?You have increasing numbness or weakness in your face. ?You  have severe pain. ?Summary ?After the procedure, it is common to have mild jaw discomfort on the surgical side when you are eating or chewing. This may last up to 2-4 weeks. ?Follow instructions from your health care provider about how to take care of your incision. ?If your mouth or jaw is sore, try eating soft foods until you feel  better. ?Return to your normal activities as told by your health care provider. Ask your health care provider what activities are safe for you. ?This information is not intended to replace advice given to you by your health care provider. Make sure you discuss any questions you have with your health care provider. ?Document Revised: 07/05/2018 Document Reviewed: 07/07/2018 ?Elsevier Patient Education ? 2022 [employer] ?

## 2021-12-09 NOTE — Anesthesia Procedure Notes (Signed)
Procedure Name: Intubation ?Date/Time: 12/09/2021 8:49 AM ?Performed by: Ezequiel Kayser, CRNA ?Pre-anesthesia Checklist: Patient identified, Emergency Drugs available, Suction available and Patient being monitored ?Patient Re-evaluated:Patient Re-evaluated prior to induction ?Oxygen Delivery Method: Circle system utilized ?Preoxygenation: Pre-oxygenation with 100% oxygen ?Induction Type: IV induction ?Ventilation: Mask ventilation without difficulty ?Laryngoscope Size: Sabra Heck and 2 ?Grade View: Grade III ?Tube type: Oral ?Tube size: 7.0 mm ?Number of attempts: 1 ?Airway Equipment and Method: Stylet and Oral airway ?Placement Confirmation: ETT inserted through vocal cords under direct vision, positive ETCO2 and breath sounds checked- equal and bilateral ?Secured at: 23 cm ?Tube secured with: Tape ?Dental Injury: Teeth and Oropharynx as per pre-operative assessment  ? ? ? ? ?

## 2021-12-09 NOTE — H&P (Signed)
Cc: Right parotid mass ? ?HPI: ?The patient is a 76 year old female who returns today for follow-up evaluation.  The patient was last seen 1 week ago.  At that time, she was noted to have a palpable soft tissue mass within the tail of the right parotid gland.  Previous CT scan showed a 2.1 cm well-circumscribed ovoid soft tissue mass within the deep lobe of the right parotid gland.  Her CT images were not available for review at that time.  We have since obtained the CD, and the images were reviewed.  According to the patient, she continues to have slight numbness on the right side of her face.  The numbness is not complete.  She denies any facial weakness.  She also denies any dysphagia or odynophagia.  ? ?Exam: ?General: Communicates without difficulty, well nourished, no acute distress. Head: Normocephalic, no evidence injury, no tenderness, facial buttresses intact without stepoff. Face/sinus: No tenderness to palpation and percussion. Facial movement is normal and symmetric. Eyes: PERRL, EOMI. No scleral icterus, conjunctivae clear. Neuro: CN II exam reveals vision grossly intact.  No nystagmus at any point of gaze. Ears: Auricles well formed without lesions.  Ear canals are intact without mass or lesion.  No erythema or edema is appreciated.  The TMs are intact without fluid. Nose: External evaluation reveals normal support and skin without lesions.  Dorsum is intact.  Anterior rhinoscopy reveals pink mucosa over anterior aspect of inferior turbinates and intact septum.  No purulence noted. Oral:  Oral cavity and oropharynx are intact, symmetric, without erythema or edema.  Mucosa is moist without lesions. Neck: Full range of motion without pain.  There is no significant lymphadenopathy.  Thyroid bed within normal limits to palpation.  A palpable 2 cm mass is noted within the tail of the right parotid gland. Trachea is midline. Neuro:  CN 2-12 grossly intact. Gait normal.  ? ?Assessment  ?1.  The patient has a  2.1 cm soft tissue mass within the deep lobe of the right parotid gland. ?2.  The patient currently complains of partial numbness of her right face.  No facial nerve weakness is noted. ? ?Plan  ?1.  The patient's CT images are reviewed. ?2.  The treatment options are extensively discussed.  The options include conservative observation, fine-needle aspiration biopsy, or surgical excision with parotidectomy surgery.  The risk, benefits, and details of the treatment options are discussed.  Specifically, the risk of facial nerve injury is discussed.  Questions are invited and answered. ?3.  The patient would like to proceed with parotidectomy surgery. ?

## 2021-12-10 ENCOUNTER — Encounter (HOSPITAL_BASED_OUTPATIENT_CLINIC_OR_DEPARTMENT_OTHER): Payer: Self-pay | Admitting: Otolaryngology

## 2021-12-10 MED ORDER — OXYCODONE-ACETAMINOPHEN 5-325 MG PO TABS: 1.0000 | ORAL_TABLET | ORAL | 0 refills | Status: AC | PRN

## 2021-12-10 MED ORDER — CLINDAMYCIN HCL 300 MG PO CAPS: 300.0000 mg | ORAL_CAPSULE | Freq: Three times a day (TID) | ORAL | 0 refills | Status: AC

## 2021-12-10 NOTE — Discharge Summary (Signed)
Physician Discharge Summary  ?Patient ID: ?Samantha Elliott ?MRN: [id number] ?DOB/AGE: 05-13-1946 76 y.o. ? ?Admit date: 12/09/2021 ?Discharge date: 12/10/2021 ? ?Admission Diagnoses: Right parotid mass ? ?Discharge Diagnoses: Right parotid mass ?Principal Problem: ?  History of parotidectomy ? ? ?Discharged Condition: good ? ?Hospital Course: Pt had an uneventful overnight stay. Pt tolerated po well. No bleeding. No stridor. Facial nerve function intact. ? ? ?Consults: None ? ?Significant Diagnostic Studies: None ? ?Treatments: surgery: Right total parotidectomy ? ?Discharge Exam: ?Blood pressure (!) 149/77, pulse 86, temperature 98.5 ?F (36.9 ?C), resp. rate 16, height 5\' 5"  (1.651 m), weight 65.1 kg, SpO2 96 %. ?Incision/Wound:c/d/i ?CN 7 intact ? ?Disposition: Discharge disposition: 01-Home or Self Care ? ? ? ? ? ? ?Discharge Instructions   ? ? Activity as tolerated - No restrictions   Complete by: As directed ?  ? Diet general   Complete by: As directed ?  ? No wound care   Complete by: As directed ?  ? ?  ? ?Allergies as of 12/10/2021   ? ?   Reactions  ? Amoxicillin Diarrhea  ? Statins   ? ?  ? ?  ?Medication List  ?  ? ?STOP taking these medications   ? ?aspirin 81 MG tablet ?  ? ?  ? ?TAKE these medications   ? ?CALCIUM 500 PO ?Take 1 tablet by mouth daily. ?  ?Cats Claw 1000 MG Caps ?Take 500 mg by mouth daily. ?  ?cholecalciferol 1000 units tablet ?Commonly known as: VITAMIN D ?Take 2,000 Units by mouth daily. ?  ?clindamycin 300 MG capsule ?Commonly known as: CLEOCIN ?Take 1 capsule (300 mg total) by mouth 3 (three) times daily for 3 days. ?  ?co-enzyme Q-10 30 MG capsule ?Take 100 mg by mouth 2 (two) times daily. ?  ?multivitamin with minerals Tabs tablet ?Take 1 tablet by mouth daily. ?  ?niacin 500 MG CR tablet ?Commonly known as: NIASPAN ?Take 500 mg by mouth daily. ?  ?omega-3 acid ethyl esters 1 g capsule ?Commonly known as: LOVAZA ?Take 1 g by mouth daily. ?  ?oxyCODONE-acetaminophen 5-325 MG  tablet ?Commonly known as: Percocet ?Take 1 tablet by mouth every 4 (four) hours as needed for up to 3 days for severe pain. ?  ? ?  ? ? Follow-up Information   ? ? 12/12/2021, MD Follow up on 12/16/2021.   ?Specialty: Otolaryngology ?Why: at 1pm ?Contact information: ?[address] ?[address] ?[place] ?[phone] ? ? ?  ?  ? ?  ?  ? ?  ? ? ?Signed: ?Wilburt Messina W Nataniel Gasper ?12/10/2021, 6:37 AM ? ? ?

## 2021-12-11 LAB — SURGICAL PATHOLOGY

## 2021-12-16 ENCOUNTER — Other Ambulatory Visit: Payer: Self-pay | Admitting: Otolaryngology

## 2021-12-16 ENCOUNTER — Other Ambulatory Visit (HOSPITAL_COMMUNITY): Payer: Self-pay | Admitting: Otolaryngology

## 2021-12-16 DIAGNOSIS — E041 Nontoxic single thyroid nodule: Secondary | ICD-10-CM

## 2021-12-26 ENCOUNTER — Ambulatory Visit (HOSPITAL_COMMUNITY)
Admission: RE | Admit: 2021-12-26 | Discharge: 2021-12-26 | Disposition: A | Discharge status: Home / Self Care | Payer: Medicare PPO | Source: Ambulatory Visit | Attending: Otolaryngology | Admitting: Otolaryngology

## 2021-12-27 ENCOUNTER — Ambulatory Visit (HOSPITAL_COMMUNITY): Admission: RE | Admit: 2021-12-27 | Payer: Medicare PPO | Source: Ambulatory Visit

## 2022-01-02 ENCOUNTER — Encounter (HOSPITAL_COMMUNITY): Payer: Self-pay

## 2022-01-02 ENCOUNTER — Ambulatory Visit (HOSPITAL_COMMUNITY)
Admission: RE | Admit: 2022-01-02 | Discharge: 2022-01-02 | Disposition: A | Discharge status: Home / Self Care | Payer: Medicare PPO | Source: Ambulatory Visit | Attending: Otolaryngology | Admitting: Otolaryngology

## 2022-01-02 DIAGNOSIS — E041 Nontoxic single thyroid nodule: Secondary | ICD-10-CM | POA: Insufficient documentation

## 2022-01-02 MED ORDER — LIDOCAINE HCL (PF) 2 % IJ SOLN
INTRAMUSCULAR | Status: AC
  Administered 2022-01-02: 10 mL
  Filled 2022-01-02: qty 10

## 2022-01-02 NOTE — Progress Notes (Signed)
PT tolerated thyroid biopsy procedure well today. Labs and afirma obtained and sent for pathology. PT ambulatory at discharge with no acute distress noted and verbalized understanding of discharge instructions and give ice pack to take home. ?

## 2022-01-03 LAB — CYTOLOGY - NON PAP

## 2022-07-24 ENCOUNTER — Other Ambulatory Visit: Payer: Self-pay | Admitting: Orthopaedic Surgery

## 2022-08-07 NOTE — Patient Instructions (Addendum)
SURGICAL WAITING ROOM VISITATION Patients having surgery or a procedure may have no more than 2 support people in the waiting area - these visitors may rotate.   Children under the age of 4 must have an adult with them who is not the patient. If the patient needs to stay at the hospital during part of their recovery, the visitor guidelines for inpatient rooms apply. Pre-op nurse will coordinate an appropriate time for 1 support person to accompany patient in pre-op.  This support person may not rotate.    Please refer to the [facility] website for the visitor guidelines for Inpatients (after your surgery is over and you are in a regular room).      Your procedure is scheduled on: 08-26-22   Report to [facility] Main Entrance    Report to admitting at 5:15 AM   Call this number if you have problems the morning of surgery [phone]   Do not eat food :After Midnight.   After Midnight you may have the following liquids until  4:30 AM DAY OF SURGERY  Water Non-Citrus Juices (without pulp, NO RED) Carbonated Beverages Black Coffee (NO MILK/CREAM OR CREAMERS, sugar ok)  Clear Tea (NO MILK/CREAM OR CREAMERS, sugar ok) regular and decaf                             Plain Jell-O (NO RED)                                           Fruit ices (not with fruit pulp, NO RED)                                     Popsicles (NO RED)                                                               Sports drinks like Gatorade (NO RED)                   The day of surgery:  Drink ONE (1) Pre-Surgery Clear Ensure at 4:30 AM the morning of surgery. Drink in one sitting. Do not sip.  This drink was given to you during your hospital  pre-op appointment visit. Nothing else to drink after completing the Pre-Surgery Clear Ensure .          If you have questions, please contact your surgeon's office.   FOLLOW BOWEL PREP AND ANY ADDITIONAL PRE OP INSTRUCTIONS YOU RECEIVED FROM YOUR SURGEON'S  OFFICE!!!     Oral Hygiene is also important to reduce your risk of infection.                                    Remember - BRUSH YOUR TEETH THE MORNING OF SURGERY WITH YOUR REGULAR TOOTHPASTE   Do NOT smoke after Midnight   Take these medicines the morning of surgery with A SIP OF WATER: None  You may not have any metal on your body including hair pins, jewelry, and body piercing             Do not wear make-up, lotions, powders, perfumes or deodorant  Do not wear nail polish including gel and S&S, artificial/acrylic nails, or any other type of covering on natural nails including finger and toenails. If you have artificial nails, gel coating, etc. that needs to be removed by a nail salon please have this removed prior to surgery or surgery may need to be canceled/ delayed if the surgeon/ anesthesia feels like they are unable to be safely monitored.   Do not shave  48 hours prior to surgery.    Do not bring valuables to the hospital. [place].   Contacts, dentures or bridgework may not be worn into surgery.  DO NOT [place]. PHARMACY WILL DISPENSE MEDICATIONS LISTED ON YOUR MEDICATION LIST TO YOU DURING YOUR ADMISSION [place]!    Patients discharged on the day of surgery will not be allowed to drive home.  Someone NEEDS to stay with you for the first 24 hours after anesthesia.   Special Instructions: Bring a copy of your healthcare power of attorney and living will documents the day of surgery if you haven't scanned them before.              Please read over the following fact sheets you were given: IF [place]  If you received a COVID test during your pre-op visit  it is requested that you wear a mask when out in public, stay away from anyone that may not be feeling well and notify your surgeon if you  develop symptoms. If you test positive for Covid or have been in contact with anyone that has tested positive in the last 10 days please notify you surgeon.  [place] - Preparing for Surgery Before surgery, you can play an important role.  Because skin is not sterile, your skin needs to be as free of germs as possible.  You can reduce the number of germs on your skin by washing with CHG (chlorahexidine gluconate) soap before surgery.  CHG is an antiseptic cleaner which kills germs and bonds with the skin to continue killing germs even after washing. Please DO NOT use if you have an allergy to CHG or antibacterial soaps.  If your skin becomes reddened/irritated stop using the CHG and inform your nurse when you arrive at Short Stay. Do not shave (including legs and underarms) for at least 48 hours prior to the first CHG shower.  You may shave your face/neck.  Please follow these instructions carefully:  1.  Shower with CHG Soap the night before surgery and the  morning of surgery.  2.  If you choose to wash your hair, wash your hair first as usual with your normal  shampoo.  3.  After you shampoo, rinse your hair and body thoroughly to remove the shampoo.                             4.  Use CHG as you would any other liquid soap.  You can apply chg directly to the skin and wash.  Gently with a scrungie or clean washcloth.  5.  Apply the CHG Soap to your body ONLY FROM THE  NECK DOWN.   Do   not use on face/ open                           Wound or open sores. Avoid contact with eyes, ears mouth and   genitals (private parts).                       Wash face,  Genitals (private parts) with your normal soap.             6.  Wash thoroughly, paying special attention to the area where your    surgery  will be performed.  7.  Thoroughly rinse your body with warm water from the neck down.  8.  DO NOT shower/wash with your normal soap after using and rinsing off the CHG Soap.                9.  Pat  yourself dry with a clean towel.            10.  Wear clean pajamas.            11.  Place clean sheets on your bed the night of your first shower and do not  sleep with pets. Day of Surgery : Do not apply any lotions/deodorants the morning of surgery.  Please wear clean clothes to the hospital/surgery center.  FAILURE TO FOLLOW THESE INSTRUCTIONS MAY RESULT IN THE CANCELLATION OF YOUR SURGERY  PATIENT SIGNATURE_________________________________  NURSE SIGNATURE__________________________________  ________________________________________________________________________    Adam Phenix  An incentive spirometer is a tool that can help keep your lungs clear and active. This tool measures how well you are filling your lungs with each breath. Taking long deep breaths may help reverse or decrease the chance of developing breathing (pulmonary) problems (especially infection) following: A long period of time when you are unable to move or be active. BEFORE THE PROCEDURE  If the spirometer includes an indicator to show your best effort, your nurse or respiratory therapist will set it to a desired goal. If possible, sit up straight or lean slightly forward. Try not to slouch. Hold the incentive spirometer in an upright position. INSTRUCTIONS FOR USE  Sit on the edge of your bed if possible, or sit up as far as you can in bed or on a chair. Hold the incentive spirometer in an upright position. Breathe out normally. Place the mouthpiece in your mouth and seal your lips tightly around it. Breathe in slowly and as deeply as possible, raising the piston or the ball toward the top of the column. Hold your breath for 3-5 seconds or for as long as possible. Allow the piston or ball to fall to the bottom of the column. Remove the mouthpiece from your mouth and breathe out normally. Rest for a few seconds and repeat Steps 1 through 7 at least 10 times every 1-2 hours when you are awake. Take your  time and take a few normal breaths between deep breaths. The spirometer may include an indicator to show your best effort. Use the indicator as a goal to work toward during each repetition. After each set of 10 deep breaths, practice coughing to be sure your lungs are clear. If you have an incision (the cut made at the time of surgery), support your incision when coughing by placing a pillow or rolled up towels firmly against it. Once you are able to get out of bed,  walk around indoors and cough well. You may stop using the incentive spirometer when instructed by your caregiver.  RISKS AND COMPLICATIONS Take your time so you do not get dizzy or light-headed. If you are in pain, you may need to take or ask for pain medication before doing incentive spirometry. It is harder to take a deep breath if you are having pain. AFTER USE Rest and breathe slowly and easily. It can be helpful to keep track of a log of your progress. Your caregiver can provide you with a simple table to help with this. If you are using the spirometer at home, follow these instructions: SEEK MEDICAL CARE IF:  You are having difficultly using the spirometer. You have trouble using the spirometer as often as instructed. Your pain medication is not giving enough relief while using the spirometer. You develop fever of 100.5 F (38.1 C) or higher. SEEK IMMEDIATE MEDICAL CARE IF:  You cough up bloody sputum that had not been present before. You develop fever of 102 F (38.9 C) or greater. You develop worsening pain at or near the incision site. MAKE SURE YOU:  Understand these instructions. Will watch your condition. Will get help right away if you are not doing well or get worse. Document Released: 01/26/2007 Document Revised: 12/08/2011 Document Reviewed: 03/29/2007 [facility] Patient Information 2014 [place], [place].   ________________________________________________________________________

## 2022-08-12 NOTE — Progress Notes (Addendum)
COVID Vaccine Completed:  Date of COVID positive in last 90 days:  PCP - Ernestine Conrad, MD Cardiologist -   Chest x-ray -  EKG -  Stress Test -  ECHO -  Cardiac Cath -  Pacemaker/ICD device last checked: Spinal Cord Stimulator:  Bowel Prep -   Sleep Study -  CPAP -   Fasting Blood Sugar -  Checks Blood Sugar _____ times a day  Last dose of GLP1 agonist-  N/A GLP1 instructions:  N/A   Last dose of SGLT-2 inhibitors-  N/A SGLT-2 instructions: N/A   Blood Thinner Instructions: Aspirin Instructions: Last Dose:  Activity level:  Can go up a flight of stairs and perform activities of daily living without stopping and without symptoms of chest pain or shortness of breath.  Able to exercise without symptoms  Unable to go up a flight of stairs without symptoms of     Anesthesia review:   Patient denies shortness of breath, fever, cough and chest pain at PAT appointment  Patient verbalized understanding of instructions that were given to them at the PAT appointment. Patient was also instructed that they will need to review over the PAT instructions again at home before surgery.

## 2022-08-14 ENCOUNTER — Encounter (HOSPITAL_COMMUNITY)
Admission: RE | Admit: 2022-08-14 | Discharge: 2022-08-14 | Disposition: A | Discharge status: Home / Self Care | Payer: Medicare PPO | Source: Ambulatory Visit | Attending: Orthopaedic Surgery | Admitting: Orthopaedic Surgery

## 2022-08-14 ENCOUNTER — Encounter (HOSPITAL_COMMUNITY): Payer: Self-pay

## 2022-08-14 ENCOUNTER — Other Ambulatory Visit: Payer: Self-pay

## 2022-08-14 ENCOUNTER — Ambulatory Visit (HOSPITAL_COMMUNITY)
Admission: RE | Admit: 2022-08-14 | Discharge: 2022-08-14 | Disposition: A | Discharge status: Home / Self Care | Payer: Medicare PPO | Source: Ambulatory Visit | Attending: Orthopaedic Surgery | Admitting: Orthopaedic Surgery

## 2022-08-14 VITALS — BP 158/87 | HR 107 | Temp 98.6°F | Resp 16 | Ht 65.0 in | Wt 142.2 lb

## 2022-08-14 DIAGNOSIS — Z01818 Encounter for other preprocedural examination: Secondary | ICD-10-CM | POA: Diagnosis present

## 2022-08-14 HISTORY — DX: Unspecified osteoarthritis, unspecified site: M19.90

## 2022-08-14 LAB — CBC
HCT: 41.7 % (ref 36.0–46.0)
Hemoglobin: 13.4 g/dL (ref 12.0–15.0)
MCH: 29.7 pg (ref 26.0–34.0)
MCHC: 32.1 g/dL (ref 30.0–36.0)
MCV: 92.5 fL (ref 80.0–100.0)
Platelets: 300 10*3/uL (ref 150–400)
RBC: 4.51 MIL/uL (ref 3.87–5.11)
RDW: 13.7 % (ref 11.5–15.5)
WBC: 4.5 10*3/uL (ref 4.0–10.5)
nRBC: 0 % (ref 0.0–0.2)

## 2022-08-14 LAB — SURGICAL PCR SCREEN
MRSA, PCR: NEGATIVE
Staphylococcus aureus: NEGATIVE

## 2022-08-25 MED ORDER — TRANEXAMIC ACID 1000 MG/10ML IV SOLN
2000.0000 mg | INTRAVENOUS | Status: DC
  Filled 2022-08-25: qty 20

## 2022-08-25 NOTE — Anesthesia Preprocedure Evaluation (Signed)
Anesthesia Evaluation  Patient identified by MRN, date of birth, ID band Patient awake    Reviewed: Allergy & Precautions, NPO status , Patient's Chart, lab work & pertinent test results  History of Anesthesia Complications Negative for: history of anesthetic complications  Airway Mallampati: III  TM Distance: >3 FB Neck ROM: Full    Dental no notable dental hx.    Pulmonary neg pulmonary ROS   Pulmonary exam normal breath sounds clear to auscultation       Cardiovascular (-) hypertension(-) angina (-) Past MI, (-) Cardiac Stents, (-) CABG and (-) Orthopnea  Rhythm:Regular Rate:Normal  HLD   Neuro/Psych negative neurological ROS     GI/Hepatic negative GI ROS, Neg liver ROS,,,  Endo/Other  negative endocrine ROS    Renal/GU negative Renal ROS     Musculoskeletal  (+) Arthritis ,    Abdominal  (+) - obese  Peds  Hematology negative hematology ROS (+)   Anesthesia Other Findings   Reproductive/Obstetrics                             Anesthesia Physical Anesthesia Plan  ASA: 2  Anesthesia Plan: Spinal, Regional and MAC   Post-op Pain Management: Regional block* and Tylenol PO (pre-op)*   Induction:   PONV Risk Score and Plan: 2 and Ondansetron and Dexamethasone  Airway Management Planned: Natural Airway and Simple Face Mask  Additional Equipment:   Intra-op Plan:   Post-operative Plan:   Informed Consent: I have reviewed the patients History and Physical, chart, labs and discussed the procedure including the risks, benefits and alternatives for the proposed anesthesia with the patient or authorized representative who has indicated his/her understanding and acceptance.     Dental advisory given  Plan Discussed with: CRNA and Anesthesiologist  Anesthesia Plan Comments: (I have discussed risks of neuraxial anesthesia including but not limited to infection, bleeding, nerve  injury, back pain, headache, seizures, and failure of block. Patient denies bleeding disorders and is not currently anticoagulated. Labs have been reviewed. Risks and benefits discussed. All patient's questions answered.   Discussed potential risks of nerve blocks including, but not limited to, infection, bleeding, nerve damage, seizures, pneumothorax, respiratory depression, and potential failure of the block. Alternatives to nerve blocks discussed. All questions answered.  Discussed with patient risks of MAC including, but not limited to, minor pain or discomfort, hearing people in the room, and possible need for backup general anesthesia. Risks for general anesthesia also discussed including, but not limited to, sore throat, hoarse voice, chipped/damaged teeth, injury to vocal cords, nausea and vomiting, allergic reactions, lung infection, heart attack, stroke, and death. All questions answered. )       Anesthesia Quick Evaluation

## 2022-08-25 NOTE — H&P (Signed)
TOTAL KNEE ADMISSION H&P  Patient is being admitted for right total knee arthroplasty.  Subjective:  Chief Complaint:right knee pain.  HPI: Samantha Elliott, 76 y.o. female, has a history of pain and functional disability in the right knee due to arthritis and has failed non-surgical conservative treatments for greater than 12 weeks to includeNSAID's and/or analgesics, corticosteriod injections, flexibility and strengthening excercises, supervised PT with diminished ADL's post treatment, use of assistive devices, weight reduction as appropriate, and activity modification.  Onset of symptoms was gradual, starting 5 years ago with gradually worsening course since that time. The patient noted no past surgery on the right knee(s).  Patient currently rates pain in the right knee(s) at 10 out of 10 with activity. Patient has night pain, worsening of pain with activity and weight bearing, pain that interferes with activities of daily living, crepitus, and joint swelling.  Patient has evidence of subchondral cysts, subchondral sclerosis, periarticular osteophytes, and joint space narrowing by imaging studies.  There is no active infection.  Patient Active Problem List   Diagnosis Date Noted   History of parotidectomy 12/09/2021   Leukopenia 05/23/2014   Past Medical History:  Diagnosis Date   Arthritis    Hyperlipidemia    Leukopenia 05/23/2014    Past Surgical History:  Procedure Laterality Date   COLONOSCOPY     PAROTIDECTOMY Right 12/09/2021   Procedure: RIGHT TOTAL PAROTIDECTOMY;  Surgeon: Newman Pies, MD;  Location: [facility];  Service: ENT;  Laterality: Right;    Current Facility-Administered Medications  Medication Dose Route Frequency Provider Last Rate Last Admin   [START ON 08/26/2022] tranexamic acid (CYKLOKAPRON) 2,000 mg in sodium chloride 0.9 % 50 mL Topical Application  2,000 mg Topical To OR Marcene Corning, MD       Current Outpatient Medications  Medication Sig  Dispense Refill Last Dose   Calcium-Magnesium-Vitamin D (CALCIUM 500 PO) Take 1 tablet by mouth daily.       Cholecalciferol (VITAMIN D) 50 MCG (2000 UT) CAPS Take 2,000 Units by mouth daily.      Coenzyme Q10 100 MG TABS Take 100 mg by mouth daily.      Multiple Vitamin (MULTIVITAMIN WITH MINERALS) TABS tablet Take 1 tablet by mouth daily. Complete      niacin (NIASPAN) 500 MG CR tablet Take 500 mg by mouth daily.      Omega-3 Fatty Acids (FISH OIL PO) Take 700 mg by mouth daily. Mini softgel      Propylene Glycol (SYSTANE COMPLETE) 0.6 % SOLN Place 1 drop into both eyes daily as needed (Dry eyes).      rosuvastatin (CRESTOR) 10 MG tablet Take 10 mg by mouth every evening.      Allergies  Allergen Reactions   Amoxicillin Diarrhea   Ezetimibe Diarrhea   Statins Other (See Comments)    Weak Muscle    Social History   Tobacco Use   Smoking status: Never   Smokeless tobacco: Never  Substance Use Topics   Alcohol use: No    Family History  Problem Relation Age of Onset   Cancer Sister        breast & uterine ca     Review of Systems  Musculoskeletal:  Positive for arthralgias.       Right knee  All other systems reviewed and are negative.   Objective:  Physical Exam Constitutional:      Appearance: Normal appearance.  HENT:     Head: Normocephalic and atraumatic.  Nose: Nose normal.     Mouth/Throat:     Pharynx: Oropharynx is clear.  Eyes:     Extraocular Movements: Extraocular movements intact.  Pulmonary:     Effort: Pulmonary effort is normal.  Abdominal:     Palpations: Abdomen is soft.  Musculoskeletal:     Cervical back: Normal range of motion.     Comments: Right knee has a mild varus deformity.  Her motion is from close to full extension to about 120 of flexion.  She has medial joint line pain and crepitation and a palpable osteophyte.  Hip motion is good and straight leg raise is negative.  Opposite knee exam is about the same with the same medial  joint line pain and crepitation.    Skin:    General: Skin is warm and dry.  Neurological:     General: No focal deficit present.     Mental Status: She is alert and oriented to person, place, and time. Mental status is at baseline.  Psychiatric:        Mood and Affect: Mood normal.        Behavior: Behavior normal.        Thought Content: Thought content normal.        Judgment: Judgment normal.     Vital signs in last 24 hours:    Labs:   Estimated body mass index is 23.66 kg/m as calculated from the following:   Height as of 08/14/22: 5\' 5"  (1.651 m).   Weight as of 08/14/22: 64.5 kg.   Imaging Review Plain radiographs demonstrate severe degenerative joint disease of the right knee(s). The overall alignment isneutral. The bone quality appears to be good for age and reported activity level.      Assessment/Plan:  End stage primary arthritis, right knee   The patient history, physical examination, clinical judgment of the provider and imaging studies are consistent with end stage degenerative joint disease of the right knee(s) and total knee arthroplasty is deemed medically necessary. The treatment options including medical management, injection therapy arthroscopy and arthroplasty were discussed at length. The risks and benefits of total knee arthroplasty were presented and reviewed. The risks due to aseptic loosening, infection, stiffness, patella tracking problems, thromboembolic complications and other imponderables were discussed. The patient acknowledged the explanation, agreed to proceed with the plan and consent was signed. Patient is being admitted for inpatient treatment for surgery, pain control, PT, OT, prophylactic antibiotics, VTE prophylaxis, progressive ambulation and ADL's and discharge planning. The patient is planning to be discharged home with home health services   Patient's anticipated LOS is less than 2 midnights, meeting these requirements: - Younger  than 2 - Lives within 1 hour of care - Has a competent adult at home to recover with post-op recover - NO history of  - Chronic pain requiring opiods  - Diabetes  - Coronary Artery Disease  - Heart failure  - Heart attack  - Stroke  - DVT/VTE  - Cardiac arrhythmia  - Respiratory Failure/COPD  - Renal failure  - Anemia  - Advanced Liver disease

## 2022-08-26 ENCOUNTER — Encounter (HOSPITAL_COMMUNITY)
Admission: RE | Disposition: A | Discharge status: Home / Self Care | Payer: Self-pay | Source: Home / Self Care | Attending: Orthopaedic Surgery

## 2022-08-26 ENCOUNTER — Ambulatory Visit (HOSPITAL_COMMUNITY)
Admission: RE | Admit: 2022-08-26 | Discharge: 2022-08-26 | Disposition: A | Discharge status: Home / Self Care | Payer: Medicare PPO | Attending: Orthopaedic Surgery | Admitting: Orthopaedic Surgery

## 2022-08-26 ENCOUNTER — Ambulatory Visit (HOSPITAL_COMMUNITY): Payer: Medicare PPO | Admitting: Anesthesiology

## 2022-08-26 ENCOUNTER — Other Ambulatory Visit: Payer: Self-pay

## 2022-08-26 ENCOUNTER — Ambulatory Visit (HOSPITAL_BASED_OUTPATIENT_CLINIC_OR_DEPARTMENT_OTHER): Payer: Medicare PPO | Admitting: Anesthesiology

## 2022-08-26 ENCOUNTER — Encounter (HOSPITAL_COMMUNITY): Payer: Self-pay | Admitting: Orthopaedic Surgery

## 2022-08-26 DIAGNOSIS — M1711 Unilateral primary osteoarthritis, right knee: Secondary | ICD-10-CM | POA: Diagnosis present

## 2022-08-26 DIAGNOSIS — E785 Hyperlipidemia, unspecified: Secondary | ICD-10-CM | POA: Insufficient documentation

## 2022-08-26 HISTORY — PX: TOTAL KNEE ARTHROPLASTY: SHX125

## 2022-08-26 SURGERY — ARTHROPLASTY, KNEE, TOTAL
Anesthesia: Monitor Anesthesia Care | Site: Knee | Laterality: Right

## 2022-08-26 MED ORDER — KETOROLAC TROMETHAMINE 15 MG/ML IJ SOLN
INTRAMUSCULAR | Status: AC
  Administered 2022-08-26: 7.5 mg via INTRAVENOUS
  Filled 2022-08-26: qty 1

## 2022-08-26 MED ORDER — BUPIVACAINE IN DEXTROSE 0.75-8.25 % IT SOLN
INTRATHECAL | Status: DC | PRN
  Administered 2022-08-26: 1.6 mL via INTRATHECAL

## 2022-08-26 MED ORDER — HYDROCODONE-ACETAMINOPHEN 5-325 MG PO TABS: 1.0000 | ORAL_TABLET | ORAL | Status: DC | PRN

## 2022-08-26 MED ORDER — ACETAMINOPHEN 500 MG PO TABS: 500.0000 mg | ORAL_TABLET | Freq: Four times a day (QID) | ORAL | Status: DC

## 2022-08-26 MED ORDER — LACTATED RINGERS IV BOLUS
250.0000 mL | Freq: Once | INTRAVENOUS | Status: AC
  Administered 2022-08-26: 250 mL via INTRAVENOUS

## 2022-08-26 MED ORDER — LACTATED RINGERS IV SOLN
INTRAVENOUS | Status: DC
  Administered 2022-08-26: 1000 mL via INTRAVENOUS

## 2022-08-26 MED ORDER — PROPOFOL 1000 MG/100ML IV EMUL
INTRAVENOUS | Status: AC
  Filled 2022-08-26: qty 100

## 2022-08-26 MED ORDER — VANCOMYCIN HCL IN DEXTROSE 1-5 GM/200ML-% IV SOLN
1000.0000 mg | INTRAVENOUS | Status: AC
  Administered 2022-08-26: 1000 mg via INTRAVENOUS
  Filled 2022-08-26: qty 200

## 2022-08-26 MED ORDER — ACETAMINOPHEN 325 MG PO TABS: 325.0000 mg | ORAL_TABLET | Freq: Four times a day (QID) | ORAL | Status: DC | PRN

## 2022-08-26 MED ORDER — BUPIVACAINE LIPOSOME 1.3 % IJ SUSP: 20.0000 mL | Freq: Once | INTRAMUSCULAR | Status: DC

## 2022-08-26 MED ORDER — FENTANYL CITRATE PF 50 MCG/ML IJ SOSY: 25.0000 ug | PREFILLED_SYRINGE | INTRAMUSCULAR | Status: DC | PRN

## 2022-08-26 MED ORDER — OXYCODONE HCL 5 MG/5ML PO SOLN: 5.0000 mg | Freq: Once | ORAL | Status: DC | PRN

## 2022-08-26 MED ORDER — TRANEXAMIC ACID-NACL 1000-0.7 MG/100ML-% IV SOLN: 1000.0000 mg | Freq: Once | INTRAVENOUS | Status: AC

## 2022-08-26 MED ORDER — HYDROCODONE-ACETAMINOPHEN 7.5-325 MG PO TABS: 1.0000 | ORAL_TABLET | ORAL | Status: DC | PRN

## 2022-08-26 MED ORDER — PROMETHAZINE HCL 25 MG/ML IJ SOLN: 6.2500 mg | INTRAMUSCULAR | Status: DC | PRN

## 2022-08-26 MED ORDER — LACTATED RINGERS IV SOLN: INTRAVENOUS | Status: DC

## 2022-08-26 MED ORDER — SODIUM CHLORIDE (PF) 0.9 % IJ SOLN
INTRAMUSCULAR | Status: DC | PRN
  Administered 2022-08-26: 30 mL via INTRAVENOUS

## 2022-08-26 MED ORDER — KETOROLAC TROMETHAMINE 15 MG/ML IJ SOLN: 7.5000 mg | Freq: Four times a day (QID) | INTRAMUSCULAR | Status: DC

## 2022-08-26 MED ORDER — STERILE WATER FOR IRRIGATION IR SOLN
Status: DC | PRN
  Administered 2022-08-26: 2000 mL

## 2022-08-26 MED ORDER — POVIDONE-IODINE 10 % EX SWAB
2.0000 | Freq: Once | CUTANEOUS | Status: AC
  Administered 2022-08-26: 2 via TOPICAL

## 2022-08-26 MED ORDER — FENTANYL CITRATE (PF) 100 MCG/2ML IJ SOLN
INTRAMUSCULAR | Status: AC
  Filled 2022-08-26: qty 2

## 2022-08-26 MED ORDER — MIDAZOLAM HCL 2 MG/2ML IJ SOLN
INTRAMUSCULAR | Status: AC
  Filled 2022-08-26: qty 2

## 2022-08-26 MED ORDER — 0.9 % SODIUM CHLORIDE (POUR BTL) OPTIME
TOPICAL | Status: DC | PRN
  Administered 2022-08-26: 1000 mL

## 2022-08-26 MED ORDER — LIDOCAINE 2% (20 MG/ML) 5 ML SYRINGE
INTRAMUSCULAR | Status: DC | PRN
  Administered 2022-08-26: 40 mg via INTRAVENOUS

## 2022-08-26 MED ORDER — ACETAMINOPHEN 500 MG PO TABS
ORAL_TABLET | ORAL | Status: AC
  Administered 2022-08-26: 500 mg via ORAL
  Filled 2022-08-26: qty 1

## 2022-08-26 MED ORDER — METOCLOPRAMIDE HCL 5 MG PO TABS: 5.0000 mg | ORAL_TABLET | Freq: Three times a day (TID) | ORAL | Status: DC | PRN

## 2022-08-26 MED ORDER — METHOCARBAMOL 500 MG IVPB - SIMPLE MED: 500.0000 mg | Freq: Four times a day (QID) | INTRAVENOUS | Status: DC | PRN

## 2022-08-26 MED ORDER — PHENYLEPHRINE HCL-NACL 20-0.9 MG/250ML-% IV SOLN
INTRAVENOUS | Status: DC | PRN
  Administered 2022-08-26: 20 ug/min via INTRAVENOUS

## 2022-08-26 MED ORDER — PROPOFOL 500 MG/50ML IV EMUL
INTRAVENOUS | Status: DC | PRN
  Administered 2022-08-26: 75 ug/kg/min via INTRAVENOUS

## 2022-08-26 MED ORDER — ONDANSETRON HCL 4 MG/2ML IJ SOLN
INTRAMUSCULAR | Status: DC | PRN
  Administered 2022-08-26: 4 mg via INTRAVENOUS

## 2022-08-26 MED ORDER — BUPIVACAINE LIPOSOME 1.3 % IJ SUSP
INTRAMUSCULAR | Status: AC
  Filled 2022-08-26: qty 20

## 2022-08-26 MED ORDER — BUPIVACAINE LIPOSOME 1.3 % IJ SUSP
INTRAMUSCULAR | Status: DC | PRN
  Administered 2022-08-26: 20 mL

## 2022-08-26 MED ORDER — DEXAMETHASONE SODIUM PHOSPHATE 10 MG/ML IJ SOLN
INTRAMUSCULAR | Status: AC
  Filled 2022-08-26: qty 1

## 2022-08-26 MED ORDER — OXYCODONE HCL 5 MG PO TABS: 5.0000 mg | ORAL_TABLET | Freq: Once | ORAL | Status: DC | PRN

## 2022-08-26 MED ORDER — ORAL CARE MOUTH RINSE: 15.0000 mL | Freq: Once | OROMUCOSAL | Status: AC

## 2022-08-26 MED ORDER — TRANEXAMIC ACID-NACL 1000-0.7 MG/100ML-% IV SOLN
INTRAVENOUS | Status: AC
  Administered 2022-08-26: 1000 mg via INTRAVENOUS
  Filled 2022-08-26: qty 100

## 2022-08-26 MED ORDER — METOCLOPRAMIDE HCL 5 MG/ML IJ SOLN: 5.0000 mg | Freq: Three times a day (TID) | INTRAMUSCULAR | Status: DC | PRN

## 2022-08-26 MED ORDER — ONDANSETRON HCL 4 MG/2ML IJ SOLN: 4.0000 mg | Freq: Four times a day (QID) | INTRAMUSCULAR | Status: DC | PRN

## 2022-08-26 MED ORDER — HYDROCODONE-ACETAMINOPHEN 5-325 MG PO TABS: 1.0000 | ORAL_TABLET | Freq: Four times a day (QID) | ORAL | 0 refills | Status: AC | PRN

## 2022-08-26 MED ORDER — ONDANSETRON HCL 4 MG/2ML IJ SOLN
INTRAMUSCULAR | Status: AC
  Filled 2022-08-26: qty 2

## 2022-08-26 MED ORDER — BUPIVACAINE-EPINEPHRINE 0.5% -1:200000 IJ SOLN
INTRAMUSCULAR | Status: DC | PRN
  Administered 2022-08-26: 30 mL

## 2022-08-26 MED ORDER — MORPHINE SULFATE (PF) 2 MG/ML IV SOLN: 0.5000 mg | INTRAVENOUS | Status: DC | PRN

## 2022-08-26 MED ORDER — BUPIVACAINE-EPINEPHRINE (PF) 0.5% -1:200000 IJ SOLN
INTRAMUSCULAR | Status: AC
  Filled 2022-08-26: qty 30

## 2022-08-26 MED ORDER — METHOCARBAMOL 500 MG PO TABS: 500.0000 mg | ORAL_TABLET | Freq: Four times a day (QID) | ORAL | Status: DC | PRN

## 2022-08-26 MED ORDER — ONDANSETRON HCL 4 MG PO TABS: 4.0000 mg | ORAL_TABLET | Freq: Four times a day (QID) | ORAL | Status: DC | PRN

## 2022-08-26 MED ORDER — TIZANIDINE HCL 2 MG PO TABS: 2.0000 mg | ORAL_TABLET | Freq: Four times a day (QID) | ORAL | 1 refills | Status: AC | PRN

## 2022-08-26 MED ORDER — CEFAZOLIN SODIUM-DEXTROSE 2-3 GM-%(50ML) IV SOLR
INTRAVENOUS | Status: DC | PRN
  Administered 2022-08-26: 2 g via INTRAVENOUS

## 2022-08-26 MED ORDER — DEXAMETHASONE SODIUM PHOSPHATE 10 MG/ML IJ SOLN
INTRAMUSCULAR | Status: DC | PRN
  Administered 2022-08-26: 8 mg via INTRAVENOUS

## 2022-08-26 MED ORDER — FENTANYL CITRATE (PF) 100 MCG/2ML IJ SOLN
INTRAMUSCULAR | Status: DC | PRN
  Administered 2022-08-26: 50 ug via INTRAVENOUS

## 2022-08-26 MED ORDER — ASPIRIN 81 MG PO TBEC: 81.0000 mg | DELAYED_RELEASE_TABLET | Freq: Two times a day (BID) | ORAL | 0 refills | Status: AC

## 2022-08-26 MED ORDER — PROPOFOL 10 MG/ML IV BOLUS
INTRAVENOUS | Status: AC
  Filled 2022-08-26: qty 20

## 2022-08-26 MED ORDER — TRANEXAMIC ACID 1000 MG/10ML IV SOLN
INTRAVENOUS | Status: DC | PRN
  Administered 2022-08-26: 2000 mg via TOPICAL

## 2022-08-26 MED ORDER — CHLORHEXIDINE GLUCONATE 0.12 % MT SOLN
15.0000 mL | Freq: Once | OROMUCOSAL | Status: AC
  Administered 2022-08-26: 15 mL via OROMUCOSAL

## 2022-08-26 MED ORDER — SODIUM CHLORIDE 0.9 % IR SOLN
Status: DC | PRN
  Administered 2022-08-26: 1000 mL

## 2022-08-26 MED ORDER — ACETAMINOPHEN 500 MG PO TABS
1000.0000 mg | ORAL_TABLET | Freq: Once | ORAL | Status: AC
  Administered 2022-08-26: 1000 mg via ORAL
  Filled 2022-08-26: qty 2

## 2022-08-26 MED ORDER — ROPIVACAINE HCL 5 MG/ML IJ SOLN
INTRAMUSCULAR | Status: DC | PRN
  Administered 2022-08-26: 20 mL via PERINEURAL

## 2022-08-26 MED ORDER — TRANEXAMIC ACID-NACL 1000-0.7 MG/100ML-% IV SOLN
1000.0000 mg | INTRAVENOUS | Status: AC
  Administered 2022-08-26: 1000 mg via INTRAVENOUS
  Filled 2022-08-26: qty 100

## 2022-08-26 MED ORDER — LACTATED RINGERS IV BOLUS
500.0000 mL | Freq: Once | INTRAVENOUS | Status: AC
  Administered 2022-08-26: 500 mL via INTRAVENOUS

## 2022-08-26 MED ORDER — CEFAZOLIN SODIUM-DEXTROSE 2-4 GM/100ML-% IV SOLN: 2.0000 g | Freq: Four times a day (QID) | INTRAVENOUS | Status: DC

## 2022-08-26 SURGICAL SUPPLY — 52 items
ATTUNE MED DOME PAT 38 KNEE (Knees) IMPLANT
ATTUNE PS FEM RT SZ 5 CEM KNEE (Femur) IMPLANT
ATTUNE PSRP INSE SZ5 7 KNEE (Insert) IMPLANT
BAG COUNTER SPONGE SURGICOUNT (BAG) ×1 IMPLANT
BAG DECANTER FOR FLEXI CONT (MISCELLANEOUS) ×1 IMPLANT
BAG ZIPLOCK 12X15 (MISCELLANEOUS) ×1 IMPLANT
BASE TIBIA ATTUNE KNEE SYS SZ6 (Knees) IMPLANT
BLADE SAGITTAL 25.0X1.19X90 (BLADE) ×1 IMPLANT
BLADE SAW SGTL 11.0X1.19X90.0M (BLADE) ×1 IMPLANT
BLADE SURG SZ10 CARB STEEL (BLADE) ×1 IMPLANT
BNDG ELASTIC 6X5.8 VLCR STR LF (GAUZE/BANDAGES/DRESSINGS) ×1 IMPLANT
BOOTIES KNEE HIGH SLOAN (MISCELLANEOUS) ×1 IMPLANT
BOWL SMART MIX CTS (DISPOSABLE) ×1 IMPLANT
CEMENT HV SMART SET (Cement) ×2 IMPLANT
COVER SURGICAL LIGHT HANDLE (MISCELLANEOUS) ×1 IMPLANT
CUFF TOURN SGL QUICK 34 (TOURNIQUET CUFF) ×1
CUFF TRNQT CYL 34X4.125X (TOURNIQUET CUFF) ×1 IMPLANT
DRAPE INCISE IOBAN 66X45 STRL (DRAPES) ×1 IMPLANT
DRAPE SHEET LG 3/4 BI-LAMINATE (DRAPES) ×1 IMPLANT
DRAPE TOP 10253 STERILE (DRAPES) ×1 IMPLANT
DRAPE U-SHAPE 47X51 STRL (DRAPES) ×1 IMPLANT
DRSG AQUACEL AG ADV 3.5X10 (GAUZE/BANDAGES/DRESSINGS) ×1 IMPLANT
DURAPREP 26ML APPLICATOR (WOUND CARE) ×2 IMPLANT
ELECT REM PT RETURN 15FT ADLT (MISCELLANEOUS) ×1 IMPLANT
GLOVE BIO SURGEON STRL SZ8 (GLOVE) ×2 IMPLANT
GLOVE BIOGEL PI IND STRL 8 (GLOVE) ×2 IMPLANT
GOWN STRL REUS W/ TWL XL LVL3 (GOWN DISPOSABLE) ×2 IMPLANT
GOWN STRL REUS W/TWL XL LVL3 (GOWN DISPOSABLE) ×2
HANDPIECE INTERPULSE COAX TIP (DISPOSABLE) ×1
HOLDER FOLEY CATH W/STRAP (MISCELLANEOUS) IMPLANT
HOOD PEEL AWAY T7 (MISCELLANEOUS) ×3 IMPLANT
KIT TURNOVER KIT A (KITS) IMPLANT
MANIFOLD NEPTUNE II (INSTRUMENTS) ×1 IMPLANT
NEEDLE HYPO 22GX1.5 SAFETY (NEEDLE) ×1 IMPLANT
NS IRRIG 1000ML POUR BTL (IV SOLUTION) ×1 IMPLANT
PACK TOTAL KNEE CUSTOM (KITS) ×1 IMPLANT
PAD ARMBOARD 7.5X6 YLW CONV (MISCELLANEOUS) ×1 IMPLANT
PIN STEINMAN FIXATION KNEE (PIN) IMPLANT
PROTECTOR NERVE ULNAR (MISCELLANEOUS) ×1 IMPLANT
SET HNDPC FAN SPRY TIP SCT (DISPOSABLE) ×1 IMPLANT
SPIKE FLUID TRANSFER (MISCELLANEOUS) ×2 IMPLANT
SUT ETHIBOND NAB CT1 #1 30IN (SUTURE) ×2 IMPLANT
SUT VIC AB 0 CT1 36 (SUTURE) ×1 IMPLANT
SUT VIC AB 2-0 CT1 27 (SUTURE) ×1
SUT VIC AB 2-0 CT1 TAPERPNT 27 (SUTURE) ×1 IMPLANT
SUT VICRYL AB 3-0 FS1 BRD 27IN (SUTURE) ×1 IMPLANT
SUT VLOC 180 0 24IN GS25 (SUTURE) ×1 IMPLANT
TIBIA ATTUNE KNEE SYS BASE SZ6 (Knees) ×1 IMPLANT
TRAY FOLEY MTR SLVR 16FR STAT (SET/KITS/TRAYS/PACK) IMPLANT
WATER STERILE IRR 1000ML POUR (IV SOLUTION) ×1 IMPLANT
WRAP KNEE MAXI GEL POST OP (GAUZE/BANDAGES/DRESSINGS) ×1 IMPLANT
YANKAUER SUCT BULB TIP NO VENT (SUCTIONS) ×1 IMPLANT

## 2022-08-26 NOTE — Addendum Note (Signed)
Addendum  created 08/26/22 1040 by Sindy Guadeloupe, CRNA   Child order released for a procedure order, Clinical Note Signed, Intraprocedure Blocks edited, SmartForm saved

## 2022-08-26 NOTE — Anesthesia Procedure Notes (Addendum)
Anesthesia Regional Block: Adductor canal block   Pre-Anesthetic Checklist: , timeout performed,  Correct Patient, Correct Site, Correct Laterality,  Correct Procedure, Correct Position, site marked,  Risks and benefits discussed,  Surgical consent,  Pre-op evaluation,  At surgeon's request and post-op pain management  Laterality: Right  Prep: chloraprep       Needles:  Injection technique: Single-shot  Needle Type: Echogenic Stimulator Needle     Needle Length: 9cm  Needle Gauge: 21     Additional Needles:   Procedures:,,,, ultrasound used (permanent image in chart),,    Narrative:  Start time: 08/26/2022 6:43 AM End time: 08/26/2022 6:46 AM Injection made incrementally with aspirations every 5 mL.  Performed by: Personally  Anesthesiologist: Linton Rump, MD  Additional Notes: Discussed risks and benefits of nerve block including, but not limited to, prolonged and/or permanent nerve injury involving sensory and/or motor function. Monitors were applied and a time-out was performed. The nerve and associated structures were visualized under ultrasound guidance. After negative aspiration, local anesthetic was slowly injected around the nerve. There was no evidence of high pressure during the procedure. There were no paresthesias. VSS remained stable and the patient tolerated the procedure well.

## 2022-08-26 NOTE — Interval H&P Note (Signed)
History and Physical Interval Note:  08/26/2022 7:22 AM  Samantha Elliott  has presented today for surgery, with the diagnosis of RIGHT KNEE DEGENERATIVE JOINT DISEASE.  The various methods of treatment have been discussed with the patient and family. After consideration of risks, benefits and other options for treatment, the patient has consented to  Procedure(s): RIGHT TOTAL KNEE ARTHROPLASTY (Right) as a surgical intervention.  The patient's history has been reviewed, patient examined, no change in status, stable for surgery.  I have reviewed the patient's chart and labs.  Questions were answered to the patient's satisfaction.     Velna Ochs

## 2022-08-26 NOTE — Anesthesia Procedure Notes (Signed)
Spinal  Patient location during procedure: OR Start time: 08/26/2022 7:34 AM Reason for block: surgical anesthesia Staffing Performed: anesthesiologist  Anesthesiologist: Nilda Simmer, MD Resident/CRNA: Eben Burow, CRNA Performed by: Eben Burow, CRNA Authorized by: Nilda Simmer, MD   Preanesthetic Checklist Completed: patient identified, IV checked, site marked, risks and benefits discussed, surgical consent, monitors and equipment checked, pre-op evaluation and timeout performed Spinal Block Patient position: sitting Prep: DuraPrep and site prepped and draped Patient monitoring: heart rate, cardiac monitor, continuous pulse ox and blood pressure Approach: midline Location: L3-4 Injection technique: single-shot Needle Needle type: Pencan  Needle gauge: 24 G Needle length: 10 cm Assessment Events: CSF return Additional Notes Pt placed in sitting position, spinal kit expiration date checked and verified, 1 attempt by Jefm Miles, CRNA, 1 attempt by Dr Cheral Bay, + CSF, - heme, pt tolerated well. Dr Cheral Bay present and supervising throughout procedure. Adequate sensory level.

## 2022-08-26 NOTE — Evaluation (Signed)
Physical Therapy Evaluation Patient Details Name: Samantha Elliott MRN: [id number] DOB: 04/16/1946 Today's Date: 08/26/2022  History of Present Illness  Pt is a 76yo female presenting s/p R-TKA on 08/26/22. PMH: HLD.  Clinical Impression  Samantha Elliott is a 76 y.o. female POD 0 s/p R-TKA. Patient reports independence with mobility at baseline. Patient is now limited by functional impairments (see PT problem list below) and requires min guard for transfers and gait with RW. Patient was able to ambulate 100 feet with RW and min guard and cues for safe walker management. Patient educated on safe sequencing for stair mobility and verbalized safe guarding position for people assisting with mobility. Patient instructed in exercises to facilitate ROM and circulation. Patient will benefit from continued skilled PT interventions to address impairments and progress towards PLOF. Patient has met mobility goals at adequate level for discharge home; will continue to follow if pt continues acute stay to progress towards Mod I goals.       Recommendations for follow up therapy are one component of a multi-disciplinary discharge planning process, led by the attending physician.  Recommendations may be updated based on patient status, additional functional criteria and insurance authorization.  Follow Up Recommendations Follow physician's recommendations for discharge plan and follow up therapies      Assistance Recommended at Discharge Frequent or constant Supervision/Assistance  Patient can return home with the following  A little help with walking and/or transfers;A little help with bathing/dressing/bathroom;Assistance with cooking/housework;Assist for transportation;Help with stairs or ramp for entrance    Equipment Recommendations Rolling walker (2 wheels)  Recommendations for Other Services       Functional Status Assessment Patient has had a recent decline in their functional status and demonstrates the  ability to make significant improvements in function in a reasonable and predictable amount of time.     Precautions / Restrictions Precautions Precautions: Fall;Knee Precaution Booklet Issued: No Precaution Comments: no pillow under the knee Restrictions Weight Bearing Restrictions: No Other Position/Activity Restrictions: wbat      Mobility  Bed Mobility Overal bed mobility: Needs Assistance Bed Mobility: Supine to Sit     Supine to sit: Min guard, HOB elevated     General bed mobility comments: for safety only, no physical assist required    Transfers Overall transfer level: Needs assistance Equipment used: Rolling walker (2 wheels) Transfers: Sit to/from Stand Sit to Stand: From elevated surface, Min guard           General transfer comment: for safety only, VCs for hand placement    Ambulation/Gait Ambulation/Gait assistance: Supervision, Min guard Gait Distance (Feet): 100 Feet Assistive device: Rolling walker (2 wheels) Gait Pattern/deviations: Step-to pattern Gait velocity: decreased     General Gait Details: Pt ambulated with RW and min guard, no physical assist required or overt LOB noted.  Stairs Stairs: Yes Stairs assistance: Min guard Stair Management: Two rails, One rail Left, Step to pattern, Forwards, With cane Number of Stairs: 4 General stair comments: Pt completed two sets of stair training: forward with bilateral rails (mimic exterior stairs) and forward with L rail and R SPC (mimic interior stairs). Handout provided and all questions answered. Pt required min guard for safety only, VCs for sequencing. No overt LOB noted.  Wheelchair Mobility    Modified Rankin (Stroke Patients Only)       Balance Overall balance assessment: Needs assistance Sitting-balance support: Feet supported, No upper extremity supported Sitting balance-Leahy Scale: Good     Standing balance support: Reliant on  assistive device for balance, During  functional activity, Bilateral upper extremity supported Standing balance-Leahy Scale: Poor                               Pertinent Vitals/Pain Pain Assessment Pain Assessment: No/denies pain    Home Living Family/patient expects to be discharged to:: Private residence Living Arrangements: Alone Available Help at Discharge: Family;Available 24 hours/day (24/7 for the first week, then nights only) Type of Home: House Home Access: Stairs to enter Entrance Stairs-Rails: Right;Left;Can reach both Entrance Stairs-Number of Steps: 5 Alternate Level Stairs-Number of Steps: 13 Home Layout: Two level;Able to live on main level with bedroom/bathroom;1/2 bath on main level Home Equipment: Shower seat - built in;Toilet riser;Cane - single point      Prior Function Prior Level of Function : Independent/Modified Independent             Mobility Comments: IND ADLs Comments: IND     Hand Dominance        Extremity/Trunk Assessment   Upper Extremity Assessment Upper Extremity Assessment: Overall WFL for tasks assessed    Lower Extremity Assessment Lower Extremity Assessment: RLE deficits/detail;LLE deficits/detail RLE Deficits / Details: MMT ank DF/PF 5/5, no extensor lag noted RLE Sensation: WNL LLE Deficits / Details: MMT ank DF/PF 5/5 LLE Sensation: WNL    Cervical / Trunk Assessment Cervical / Trunk Assessment: Normal  Communication   Communication: No difficulties  Cognition Arousal/Alertness: Awake/alert Behavior During Therapy: WFL for tasks assessed/performed Overall Cognitive Status: Within Functional Limits for tasks assessed                                          General Comments General comments (skin integrity, edema, etc.): Daughter Di Kindle present    Exercises Total Joint Exercises Ankle Circles/Pumps: AROM, Both, 10 reps Quad Sets: AROM, Right, Other reps (comment) (2) Short Arc Quad: AROM, Right, Other reps (comment)  (2) Heel Slides: AROM, Right, Other reps (comment) (3) Hip ABduction/ADduction: AROM, Right, Other reps (comment) (3) Straight Leg Raises: AROM, Right, Other reps (comment) (3) Goniometric ROM: -5-60deg by gross visual approximation   Assessment/Plan    PT Assessment Patient needs continued PT services  PT Problem List Decreased strength;Decreased range of motion;Decreased activity tolerance;Decreased balance;Decreased mobility;Decreased coordination;Pain       PT Treatment Interventions DME instruction;Gait training;Stair training;Functional mobility training;Therapeutic activities;Therapeutic exercise;Balance training;Neuromuscular re-education;Patient/family education    PT Goals (Current goals can be found in the Care Plan section)  Acute Rehab PT Goals Patient Stated Goal: To walk better PT Goal Formulation: With patient Time For Goal Achievement: 09/02/22 Potential to Achieve Goals: Good    Frequency 7X/week     Co-evaluation               AM-PAC PT "6 Clicks" Mobility  Outcome Measure Help needed turning from your back to your side while in a flat bed without using bedrails?: None Help needed moving from lying on your back to sitting on the side of a flat bed without using bedrails?: A Little Help needed moving to and from a bed to a chair (including a wheelchair)?: A Little Help needed standing up from a chair using your arms (e.g., wheelchair or bedside chair)?: A Little Help needed to walk in hospital room?: A Little Help needed climbing 3-5 steps with a railing? : A  Little 6 Click Score: 19    End of Session Equipment Utilized During Treatment: Gait belt Activity Tolerance: Patient tolerated treatment well;No increased pain Patient left: in chair;with call bell/phone within reach;with restraints reapplied Nurse Communication: Mobility status PT Visit Diagnosis: Pain;Difficulty in walking, not elsewhere classified (R26.2) Pain - Right/Left: Right Pain -  part of body: Knee    Time: 7416-3845 PT Time Calculation (min) (ACUTE ONLY): 50 min   Charges:   PT Evaluation $PT Eval Low Complexity: 1 Low PT Treatments $Gait Training: 8-22 mins $Therapeutic Exercise: 8-22 mins        Coolidge Breeze, PT, DPT [facility] Department Office: [phone] Weekend pager: [phone]  Coolidge Breeze 08/26/2022, 1:05 PM

## 2022-08-26 NOTE — Transfer of Care (Signed)
Immediate Anesthesia Transfer of Care Note  Patient: Samantha Elliott  Procedure(s) Performed: RIGHT TOTAL KNEE ARTHROPLASTY (Right: Knee)  Patient Location: PACU  Anesthesia Type:Spinal  Level of Consciousness: awake, alert , and patient cooperative  Airway & Oxygen Therapy: Patient Spontanous Breathing and Patient connected to face mask oxygen  Post-op Assessment: Report given to RN and Post -op Vital signs reviewed and stable  Post vital signs: Reviewed and stable  Last Vitals:  Vitals Value Taken Time  BP 123/69 08/26/22 0926  Temp    Pulse 79 08/26/22 0928  Resp 13 08/26/22 0928  SpO2 100 % 08/26/22 0928  Vitals shown include unvalidated device data.  Last Pain:  Vitals:   08/26/22 0544  TempSrc:   PainSc: 0-No pain         Complications: No notable events documented.

## 2022-08-26 NOTE — Anesthesia Procedure Notes (Signed)
Procedure Name: MAC Date/Time: 08/26/2022 7:24 AM  Performed by: Eben Burow, CRNAPre-anesthesia Checklist: Patient identified, Emergency Drugs available, Suction available, Patient being monitored and Timeout performed Oxygen Delivery Method: Simple face mask Placement Confirmation: positive ETCO2

## 2022-08-26 NOTE — Op Note (Signed)
PREOP DIAGNOSIS: DJD RIGHT KNEE POSTOP DIAGNOSIS: same PROCEDURE: RIGHT TKR ANESTHESIA: Spinal and MAC ATTENDING SURGEON: Hessie Dibble ASSISTANT: Loni Dolly PA  INDICATIONS FOR PROCEDURE: Samantha Elliott is a 76 y.o. female who has struggled for a long time with pain due to degenerative arthritis of the right knee.  The patient has failed many conservative non-operative measures and at this point has pain which limits the ability to sleep and walk.  The patient is offered total knee replacement.  Informed operative consent was obtained after discussion of possible risks of anesthesia, infection, neurovascular injury, DVT, and death.  The importance of the post-operative rehabilitation protocol to optimize result was stressed extensively with the patient.  SUMMARY OF FINDINGS AND PROCEDURE:  Samantha Elliott was taken to the operative suite where under the above anesthesia a right knee replacement was performed.  There were advanced degenerative changes and the bone quality was good.  We used the DePuy Attune system and placed size 5  femur, 6 tibia, 38 mm all polyethylene patella, and a size 7 mm spacer.  Loni Dolly PA-C assisted throughout and was invaluable to the completion of the case in that he helped retract and maintain exposure while I placed components.  He also helped close thereby minimizing OR time.  The patient was admitted for appropriate post-op care to include perioperative antibiotics and mechanical and pharmacologic measures for DVT prophylaxis.  DESCRIPTION OF PROCEDURE:  Samantha Elliott was taken to the operative suite where the above anesthesia was applied.  The patient was positioned supine and prepped and draped in normal sterile fashion.  An appropriate time out was performed.  After the administration of vancomycin and kefzol pre-op antibiotic the leg was elevated and exsanguinated and a tourniquet inflated. A standard longitudinal incision was made on the anterior knee.   Dissection was carried down to the extensor mechanism.  All appropriate anti-infective measures were used including the pre-operative antibiotic, betadine impregnated drape, and closed hooded exhaust systems for each member of the surgical team.  A medial parapatellar incision was made in the extensor mechanism and the knee cap flipped and the knee flexed.  Some residual meniscal tissues were removed along with any remaining ACL/PCL tissue.  A guide was placed on the tibia and a flat cut was made on it's superior surface.  An intramedullary guide was placed in the femur and was utilized to make anterior and posterior cuts creating an appropriate flexion gap.  A second intramedullary guide was placed in the femur to make a distal cut properly balancing the knee with an extension gap equal to the flexion gap.  The three bones sized to the above mentioned sizes and the appropriate guides were placed and utilized.  A trial reduction was done and the knee easily came to full extension and the patella tracked well on flexion.  The trial components were removed and all bones were cleaned with pulsatile lavage and then dried thoroughly.  Cement was mixed and was pressurized onto the bones followed by placement of the aforementioned components.  Excess cement was trimmed and pressure was held on the components until the cement had hardened.  The tourniquet was deflated and a small amount of bleeding was controlled with cautery and pressure.  The knee was irrigated thoroughly.  The extensor mechanism was re-approximated with #1 ethibond in interrupted fashion.  The knee was flexed and the repair was solid.  The subcutaneous tissues were re-approximated with #0 and #2-0 vicryl and the skin closed with  a subcuticular stitch and steristrips.  A sterile dressing was applied.  Intraoperative fluids, EBL, and tourniquet time can be obtained from anesthesia records.  DISPOSITION:  The patient was taken to recovery room in stable  condition and scheduled to potentially go home same day depending on ability to walk and tolerate liquids.Samantha Elliott 08/26/2022, 9:03 AM

## 2022-08-26 NOTE — Anesthesia Postprocedure Evaluation (Signed)
Anesthesia Post Note  Patient: Samantha Elliott  Procedure(s) Performed: RIGHT TOTAL KNEE ARTHROPLASTY (Right: Knee)     Patient location during evaluation: PACU Anesthesia Type: Regional Level of consciousness: awake Pain management: pain level controlled Vital Signs Assessment: post-procedure vital signs reviewed and stable Respiratory status: spontaneous breathing, respiratory function stable and nonlabored ventilation Cardiovascular status: blood pressure returned to baseline and stable Postop Assessment: no headache, no backache and no apparent nausea or vomiting Anesthetic complications: no   No notable events documented.  Last Vitals:  Vitals:   08/26/22 1000 08/26/22 1015  BP: (!) 150/79 (!) 143/78  Pulse: 83 77  Resp: (!) 21 13  Temp:    SpO2: 97% 95%    Last Pain:  Vitals:   08/26/22 1000  TempSrc:   PainSc: 0-No pain                 Linton Rump

## 2022-08-27 ENCOUNTER — Encounter (HOSPITAL_COMMUNITY): Payer: Self-pay | Admitting: Orthopaedic Surgery

## 2022-09-02 DIAGNOSIS — R55 Syncope and collapse: Secondary | ICD-10-CM | POA: Diagnosis not present

## 2022-09-02 DIAGNOSIS — E7800 Pure hypercholesterolemia, unspecified: Secondary | ICD-10-CM | POA: Diagnosis not present

## 2022-09-02 DIAGNOSIS — Z299 Encounter for prophylactic measures, unspecified: Secondary | ICD-10-CM | POA: Diagnosis not present

## 2022-09-02 DIAGNOSIS — Z713 Dietary counseling and surveillance: Secondary | ICD-10-CM | POA: Diagnosis not present

## 2022-09-02 DIAGNOSIS — Z6824 Body mass index (BMI) 24.0-24.9, adult: Secondary | ICD-10-CM | POA: Diagnosis not present

## 2022-09-05 DIAGNOSIS — M25561 Pain in right knee: Secondary | ICD-10-CM | POA: Diagnosis not present

## 2022-09-10 DIAGNOSIS — M25561 Pain in right knee: Secondary | ICD-10-CM | POA: Diagnosis not present

## 2022-09-12 DIAGNOSIS — M25561 Pain in right knee: Secondary | ICD-10-CM | POA: Diagnosis not present

## 2022-09-15 DIAGNOSIS — M25561 Pain in right knee: Secondary | ICD-10-CM | POA: Diagnosis not present

## 2022-09-17 DIAGNOSIS — M25561 Pain in right knee: Secondary | ICD-10-CM | POA: Diagnosis not present

## 2022-09-18 DIAGNOSIS — R55 Syncope and collapse: Secondary | ICD-10-CM | POA: Diagnosis not present

## 2022-09-24 DIAGNOSIS — M25561 Pain in right knee: Secondary | ICD-10-CM | POA: Diagnosis not present

## 2022-09-26 DIAGNOSIS — M25561 Pain in right knee: Secondary | ICD-10-CM | POA: Diagnosis not present

## 2022-09-30 DIAGNOSIS — M25561 Pain in right knee: Secondary | ICD-10-CM | POA: Diagnosis not present

## 2022-10-03 DIAGNOSIS — M25561 Pain in right knee: Secondary | ICD-10-CM | POA: Diagnosis not present

## 2022-10-06 DIAGNOSIS — Z1331 Encounter for screening for depression: Secondary | ICD-10-CM | POA: Diagnosis not present

## 2022-10-06 DIAGNOSIS — Z299 Encounter for prophylactic measures, unspecified: Secondary | ICD-10-CM | POA: Diagnosis not present

## 2022-10-06 DIAGNOSIS — Z713 Dietary counseling and surveillance: Secondary | ICD-10-CM | POA: Diagnosis not present

## 2022-10-06 DIAGNOSIS — Z7189 Other specified counseling: Secondary | ICD-10-CM | POA: Diagnosis not present

## 2022-10-06 DIAGNOSIS — Z0000 Encounter for general adult medical examination without abnormal findings: Secondary | ICD-10-CM | POA: Diagnosis not present

## 2022-10-06 DIAGNOSIS — Z1339 Encounter for screening examination for other mental health and behavioral disorders: Secondary | ICD-10-CM | POA: Diagnosis not present

## 2022-10-06 DIAGNOSIS — Z6823 Body mass index (BMI) 23.0-23.9, adult: Secondary | ICD-10-CM | POA: Diagnosis not present

## 2022-10-06 DIAGNOSIS — E7800 Pure hypercholesterolemia, unspecified: Secondary | ICD-10-CM | POA: Diagnosis not present

## 2022-10-08 DIAGNOSIS — M25561 Pain in right knee: Secondary | ICD-10-CM | POA: Diagnosis not present

## 2022-10-08 DIAGNOSIS — M1990 Unspecified osteoarthritis, unspecified site: Secondary | ICD-10-CM | POA: Diagnosis not present

## 2022-10-08 DIAGNOSIS — E7800 Pure hypercholesterolemia, unspecified: Secondary | ICD-10-CM | POA: Diagnosis not present

## 2022-10-08 DIAGNOSIS — R55 Syncope and collapse: Secondary | ICD-10-CM | POA: Diagnosis not present

## 2022-10-10 DIAGNOSIS — M25561 Pain in right knee: Secondary | ICD-10-CM | POA: Diagnosis not present

## 2022-10-14 DIAGNOSIS — M25561 Pain in right knee: Secondary | ICD-10-CM | POA: Diagnosis not present

## 2022-10-16 DIAGNOSIS — M25561 Pain in right knee: Secondary | ICD-10-CM | POA: Diagnosis not present

## 2022-10-20 DIAGNOSIS — H35033 Hypertensive retinopathy, bilateral: Secondary | ICD-10-CM | POA: Diagnosis not present

## 2022-10-20 DIAGNOSIS — H04123 Dry eye syndrome of bilateral lacrimal glands: Secondary | ICD-10-CM | POA: Diagnosis not present

## 2022-10-20 DIAGNOSIS — H25813 Combined forms of age-related cataract, bilateral: Secondary | ICD-10-CM | POA: Diagnosis not present

## 2022-10-20 DIAGNOSIS — H40053 Ocular hypertension, bilateral: Secondary | ICD-10-CM | POA: Diagnosis not present

## 2022-10-20 DIAGNOSIS — H25811 Combined forms of age-related cataract, right eye: Secondary | ICD-10-CM | POA: Diagnosis not present

## 2022-10-21 DIAGNOSIS — M25561 Pain in right knee: Secondary | ICD-10-CM | POA: Diagnosis not present

## 2022-10-24 DIAGNOSIS — M25561 Pain in right knee: Secondary | ICD-10-CM | POA: Diagnosis not present

## 2022-10-28 DIAGNOSIS — M25561 Pain in right knee: Secondary | ICD-10-CM | POA: Diagnosis not present

## 2022-10-31 DIAGNOSIS — M25561 Pain in right knee: Secondary | ICD-10-CM | POA: Diagnosis not present

## 2022-11-03 DIAGNOSIS — M1711 Unilateral primary osteoarthritis, right knee: Secondary | ICD-10-CM | POA: Diagnosis not present

## 2022-11-19 DIAGNOSIS — H25811 Combined forms of age-related cataract, right eye: Secondary | ICD-10-CM | POA: Diagnosis not present

## 2022-11-19 DIAGNOSIS — H268 Other specified cataract: Secondary | ICD-10-CM | POA: Diagnosis not present

## 2022-12-10 DIAGNOSIS — H25812 Combined forms of age-related cataract, left eye: Secondary | ICD-10-CM | POA: Diagnosis not present

## 2022-12-17 DIAGNOSIS — H268 Other specified cataract: Secondary | ICD-10-CM | POA: Diagnosis not present

## 2022-12-17 DIAGNOSIS — H25812 Combined forms of age-related cataract, left eye: Secondary | ICD-10-CM | POA: Diagnosis not present

## 2023-01-20 DIAGNOSIS — Z961 Presence of intraocular lens: Secondary | ICD-10-CM | POA: Diagnosis not present

## 2023-03-09 DIAGNOSIS — I129 Hypertensive chronic kidney disease with stage 1 through stage 4 chronic kidney disease, or unspecified chronic kidney disease: Secondary | ICD-10-CM | POA: Diagnosis not present

## 2023-03-09 DIAGNOSIS — Z823 Family history of stroke: Secondary | ICD-10-CM | POA: Diagnosis not present

## 2023-03-09 DIAGNOSIS — N182 Chronic kidney disease, stage 2 (mild): Secondary | ICD-10-CM | POA: Diagnosis not present

## 2023-03-09 DIAGNOSIS — Z8249 Family history of ischemic heart disease and other diseases of the circulatory system: Secondary | ICD-10-CM | POA: Diagnosis not present

## 2023-03-09 DIAGNOSIS — M1990 Unspecified osteoarthritis, unspecified site: Secondary | ICD-10-CM | POA: Diagnosis not present

## 2023-03-09 DIAGNOSIS — E785 Hyperlipidemia, unspecified: Secondary | ICD-10-CM | POA: Diagnosis not present

## 2023-03-09 DIAGNOSIS — M8580 Other specified disorders of bone density and structure, unspecified site: Secondary | ICD-10-CM | POA: Diagnosis not present

## 2023-03-09 DIAGNOSIS — Z803 Family history of malignant neoplasm of breast: Secondary | ICD-10-CM | POA: Diagnosis not present

## 2023-03-09 DIAGNOSIS — K219 Gastro-esophageal reflux disease without esophagitis: Secondary | ICD-10-CM | POA: Diagnosis not present

## 2023-03-31 DIAGNOSIS — Z1231 Encounter for screening mammogram for malignant neoplasm of breast: Secondary | ICD-10-CM | POA: Diagnosis not present

## 2023-04-07 DIAGNOSIS — R928 Other abnormal and inconclusive findings on diagnostic imaging of breast: Secondary | ICD-10-CM | POA: Diagnosis not present

## 2023-04-07 DIAGNOSIS — R921 Mammographic calcification found on diagnostic imaging of breast: Secondary | ICD-10-CM | POA: Diagnosis not present

## 2023-04-10 DIAGNOSIS — Z299 Encounter for prophylactic measures, unspecified: Secondary | ICD-10-CM | POA: Diagnosis not present

## 2023-04-10 DIAGNOSIS — I10 Essential (primary) hypertension: Secondary | ICD-10-CM | POA: Diagnosis not present

## 2023-04-10 DIAGNOSIS — E7800 Pure hypercholesterolemia, unspecified: Secondary | ICD-10-CM | POA: Diagnosis not present

## 2023-05-08 DIAGNOSIS — M67911 Unspecified disorder of synovium and tendon, right shoulder: Secondary | ICD-10-CM | POA: Diagnosis not present

## 2023-05-11 DIAGNOSIS — Z299 Encounter for prophylactic measures, unspecified: Secondary | ICD-10-CM | POA: Diagnosis not present

## 2023-05-11 DIAGNOSIS — I10 Essential (primary) hypertension: Secondary | ICD-10-CM | POA: Diagnosis not present

## 2023-05-11 DIAGNOSIS — Z0000 Encounter for general adult medical examination without abnormal findings: Secondary | ICD-10-CM | POA: Diagnosis not present

## 2023-06-09 DIAGNOSIS — H43813 Vitreous degeneration, bilateral: Secondary | ICD-10-CM | POA: Diagnosis not present

## 2023-07-24 DIAGNOSIS — K219 Gastro-esophageal reflux disease without esophagitis: Secondary | ICD-10-CM | POA: Diagnosis not present

## 2023-07-24 DIAGNOSIS — Z299 Encounter for prophylactic measures, unspecified: Secondary | ICD-10-CM | POA: Diagnosis not present

## 2023-07-24 DIAGNOSIS — I10 Essential (primary) hypertension: Secondary | ICD-10-CM | POA: Diagnosis not present

## 2023-08-14 DIAGNOSIS — M25561 Pain in right knee: Secondary | ICD-10-CM | POA: Diagnosis not present

## 2023-08-24 DIAGNOSIS — R0989 Other specified symptoms and signs involving the circulatory and respiratory systems: Secondary | ICD-10-CM | POA: Diagnosis not present

## 2023-10-09 DIAGNOSIS — Z7189 Other specified counseling: Secondary | ICD-10-CM | POA: Diagnosis not present

## 2023-10-09 DIAGNOSIS — Z1339 Encounter for screening examination for other mental health and behavioral disorders: Secondary | ICD-10-CM | POA: Diagnosis not present

## 2023-10-09 DIAGNOSIS — I10 Essential (primary) hypertension: Secondary | ICD-10-CM | POA: Diagnosis not present

## 2023-10-09 DIAGNOSIS — Z1331 Encounter for screening for depression: Secondary | ICD-10-CM | POA: Diagnosis not present

## 2023-10-09 DIAGNOSIS — E7800 Pure hypercholesterolemia, unspecified: Secondary | ICD-10-CM | POA: Diagnosis not present

## 2023-10-09 DIAGNOSIS — Z299 Encounter for prophylactic measures, unspecified: Secondary | ICD-10-CM | POA: Diagnosis not present

## 2023-10-09 DIAGNOSIS — Z79899 Other long term (current) drug therapy: Secondary | ICD-10-CM | POA: Diagnosis not present

## 2023-10-09 DIAGNOSIS — E559 Vitamin D deficiency, unspecified: Secondary | ICD-10-CM | POA: Diagnosis not present

## 2023-10-09 DIAGNOSIS — Z789 Other specified health status: Secondary | ICD-10-CM | POA: Diagnosis not present

## 2023-10-09 DIAGNOSIS — R5383 Other fatigue: Secondary | ICD-10-CM | POA: Diagnosis not present

## 2023-10-09 DIAGNOSIS — E2839 Other primary ovarian failure: Secondary | ICD-10-CM | POA: Diagnosis not present

## 2023-10-09 DIAGNOSIS — Z0000 Encounter for general adult medical examination without abnormal findings: Secondary | ICD-10-CM | POA: Diagnosis not present

## 2023-10-13 DIAGNOSIS — R921 Mammographic calcification found on diagnostic imaging of breast: Secondary | ICD-10-CM | POA: Diagnosis not present

## 2023-10-13 DIAGNOSIS — R928 Other abnormal and inconclusive findings on diagnostic imaging of breast: Secondary | ICD-10-CM | POA: Diagnosis not present

## 2023-10-16 DIAGNOSIS — E2839 Other primary ovarian failure: Secondary | ICD-10-CM | POA: Diagnosis not present

## 2023-11-04 DIAGNOSIS — H40013 Open angle with borderline findings, low risk, bilateral: Secondary | ICD-10-CM | POA: Diagnosis not present

## 2023-12-01 DIAGNOSIS — I10 Essential (primary) hypertension: Secondary | ICD-10-CM | POA: Diagnosis not present

## 2023-12-01 DIAGNOSIS — Z299 Encounter for prophylactic measures, unspecified: Secondary | ICD-10-CM | POA: Diagnosis not present

## 2023-12-01 DIAGNOSIS — M5430 Sciatica, unspecified side: Secondary | ICD-10-CM | POA: Diagnosis not present

## 2023-12-09 IMAGING — US US FNA BIOPSY THYROID 1ST LESION
1 series · 13 of 19 positions shown · non-contrast
Comparison: US Thyroid 12/30/21

MEDICATIONS:
9 cc 1% lidocaine

COMPLICATIONS:
None immediate.

INDICATION: Left inferior thyroid nodule

2.9 cm
EXAM:
ULTRASOUND GUIDED FINE NEEDLE ASPIRATION OF INDETERMINATE THYROID
NODULE
TECHNIQUE: Informed written consent was obtained from the patient after a
discussion of the risks, benefits and alternatives to treatment.
Questions regarding the procedure were encouraged and answered. A
timeout was performed prior to the initiation of the procedure.

[Series 1: us fna bx thyroid 1st lesion afirma · 19 acquisitions, 13 frames shown]
[im 1/19]
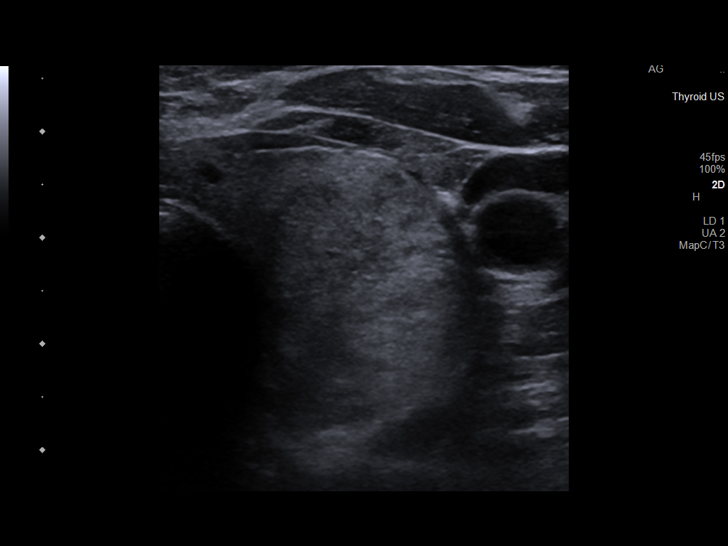
[im 3/19]
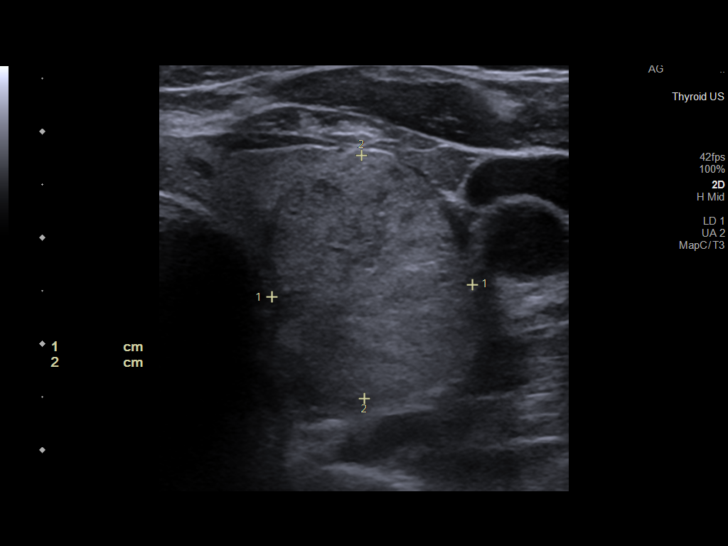
[im 4/19]
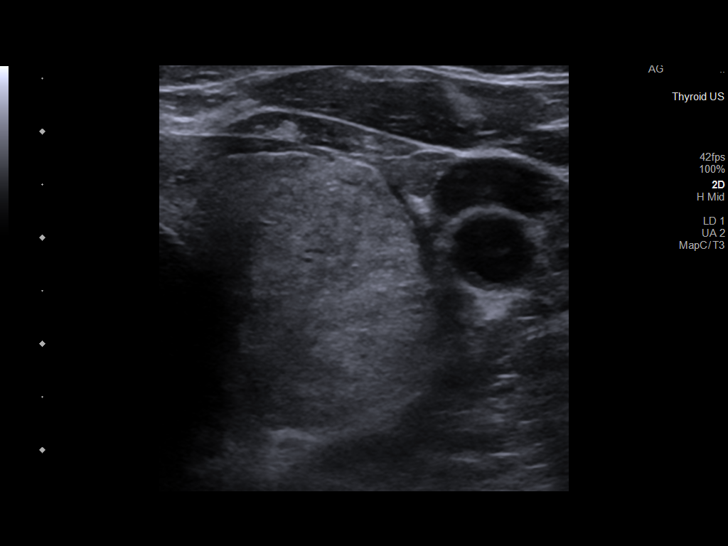
[im 6/19]
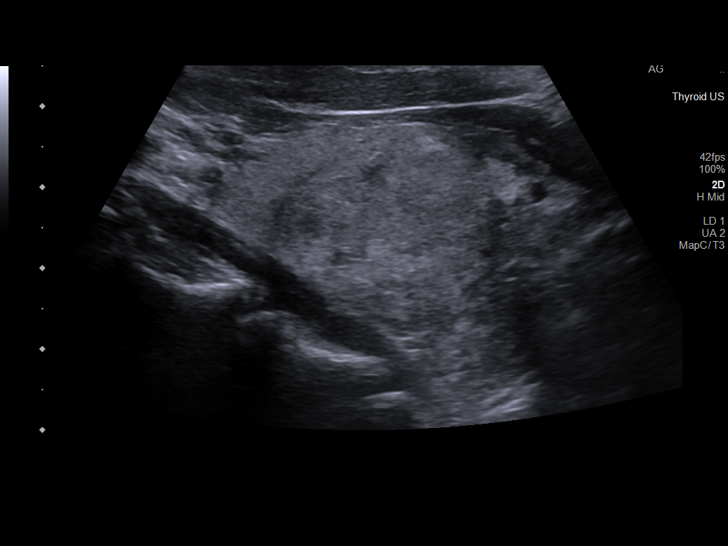
[im 7/19]
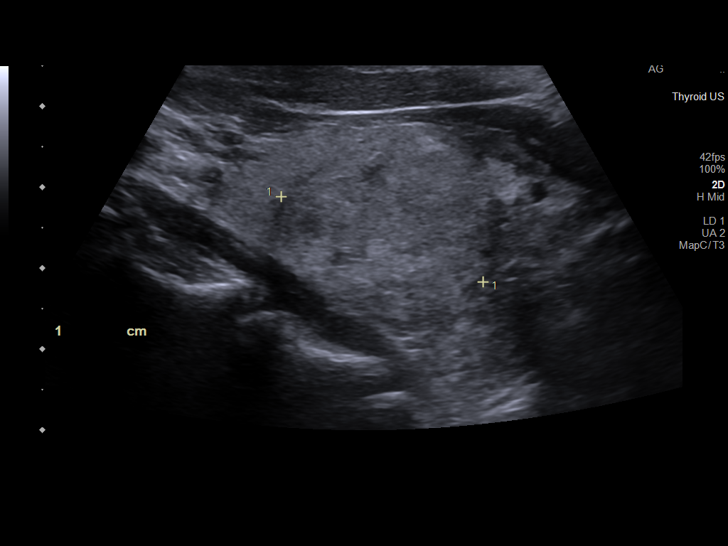
[im 9/19]
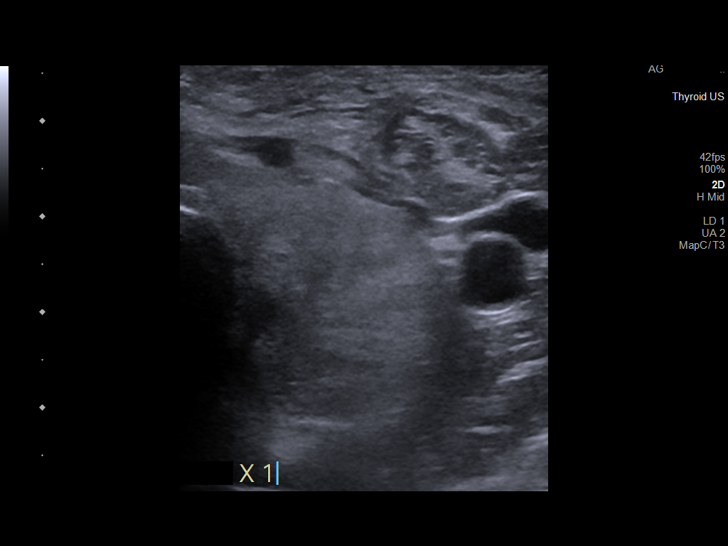
[im 10/19]
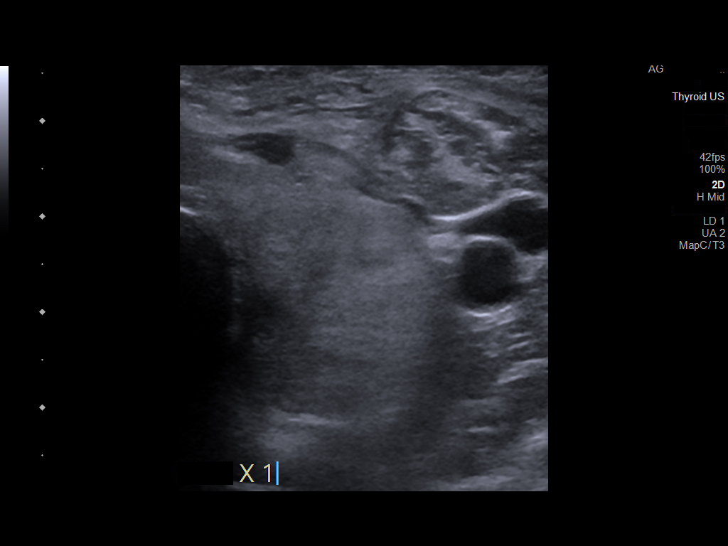
[im 11/19]
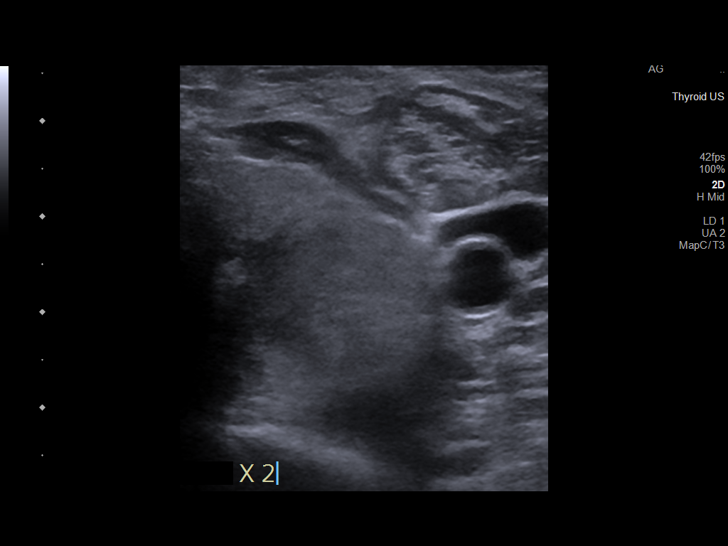
[im 13/19]
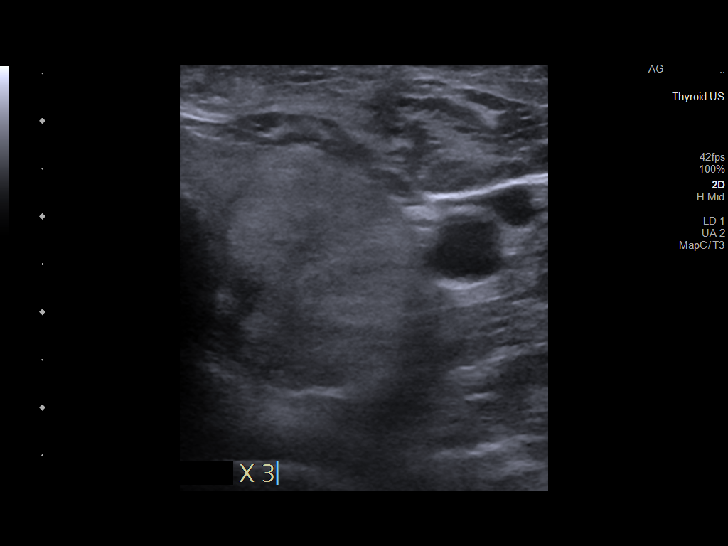
[im 14/19]
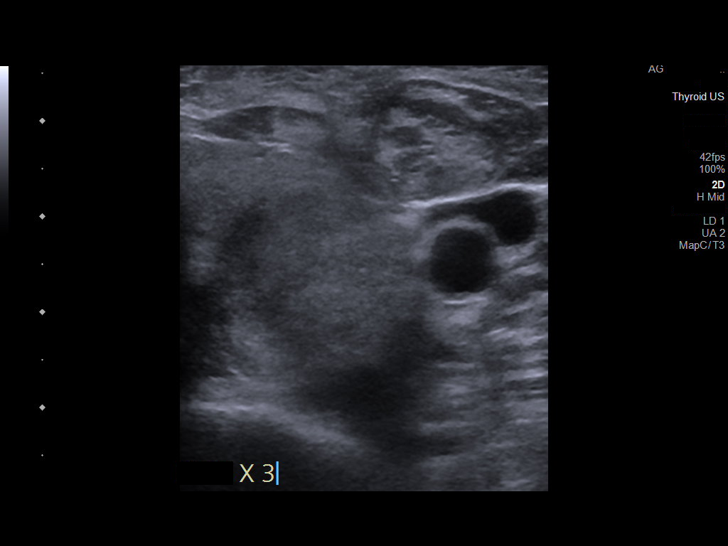
[im 16/19]
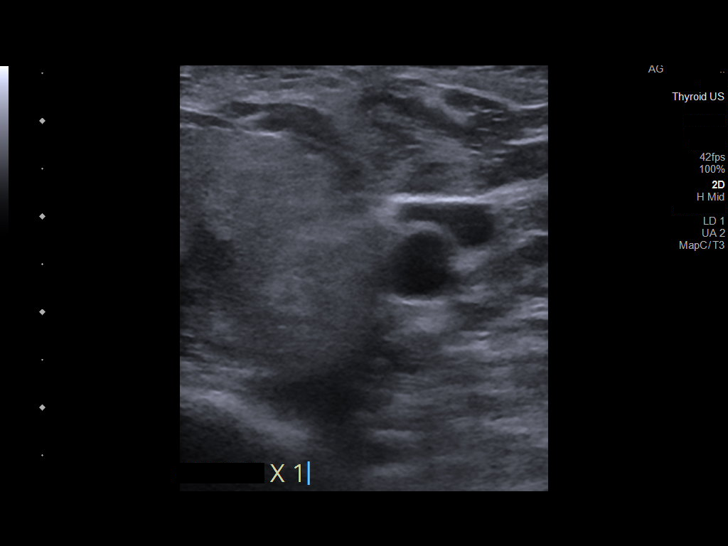
[im 17/19]
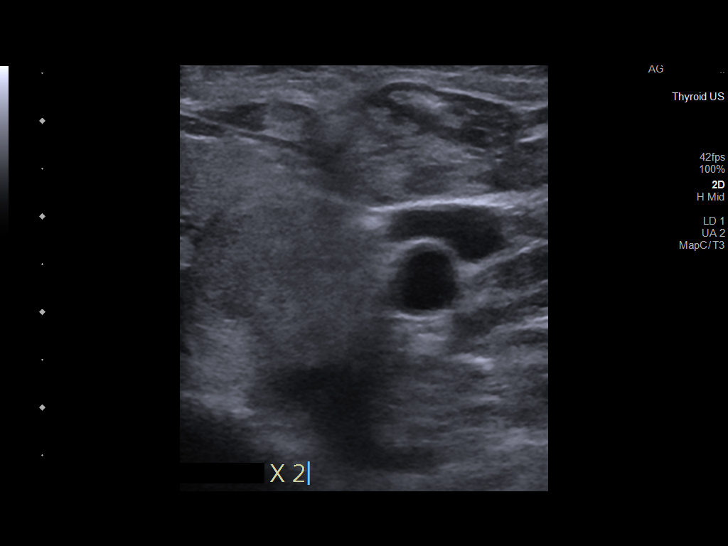
[im 19/19]
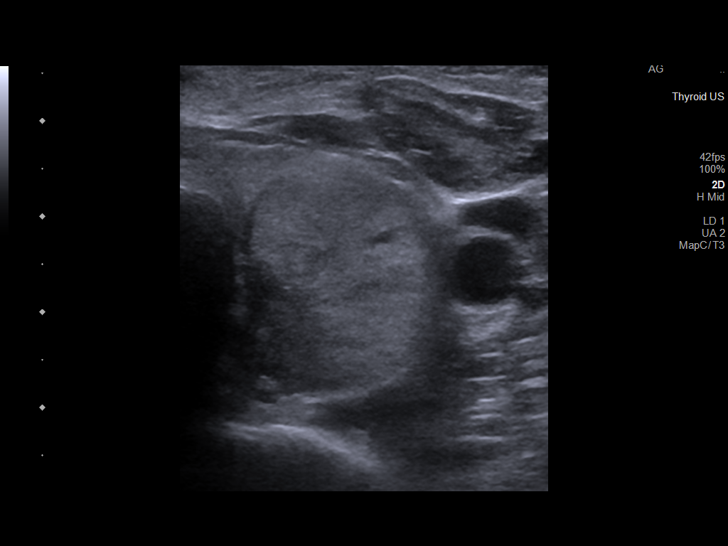

[13 of 19 positions shown; findings below may reference images not displayed]

Pre-procedural ultrasound scanning demonstrated unchanged size and
appearance of the indeterminate nodule within the left thyroid

The procedure was planned. The neck was prepped in the usual sterile
fashion, and a sterile drape was applied covering the operative
field. A timeout was performed prior to the initiation of the
procedure. Local anesthesia was provided with 1% lidocaine.

Under direct ultrasound guidance, 5 FNA biopsies were performed of
the left inferior thyroid nodule with a 25 gauge needle.

2 of these samples were obtained for AFIRMA.

Multiple ultrasound images were saved for procedural documentation
purposes. The samples were prepared and submitted to pathology.

Limited post procedural scanning was negative for hematoma or
additional complication. Dressings were placed. The patient
tolerated the above procedures procedure well without immediate
postprocedural complication.
FINDINGS: Nodule reference number based on prior diagnostic ultrasound: 2

Maximum size: 2.9 cm

Location: Left; Inferior

ACR TI-RADS risk category: TR3 (3 points)

Reason for biopsy: meets ACR TI-RADS criteria

Ultrasound imaging confirms appropriate placement of the needles
within the thyroid nodule.
IMPRESSION: Technically successful ultrasound guided fine needle aspiration of
left inferior thyroid nodule

Read by

Da Silva Capelo Salagnac

## 2024-01-21 DIAGNOSIS — H40023 Open angle with borderline findings, high risk, bilateral: Secondary | ICD-10-CM | POA: Diagnosis not present

## 2024-01-22 DIAGNOSIS — M48062 Spinal stenosis, lumbar region with neurogenic claudication: Secondary | ICD-10-CM | POA: Diagnosis not present

## 2024-02-01 DIAGNOSIS — M5459 Other low back pain: Secondary | ICD-10-CM | POA: Diagnosis not present

## 2024-02-04 DIAGNOSIS — M5459 Other low back pain: Secondary | ICD-10-CM | POA: Diagnosis not present

## 2024-02-08 DIAGNOSIS — M5459 Other low back pain: Secondary | ICD-10-CM | POA: Diagnosis not present

## 2024-02-10 DIAGNOSIS — M5459 Other low back pain: Secondary | ICD-10-CM | POA: Diagnosis not present

## 2024-02-12 DIAGNOSIS — M5459 Other low back pain: Secondary | ICD-10-CM | POA: Diagnosis not present

## 2024-02-15 DIAGNOSIS — M5459 Other low back pain: Secondary | ICD-10-CM | POA: Diagnosis not present

## 2024-02-17 DIAGNOSIS — M5459 Other low back pain: Secondary | ICD-10-CM | POA: Diagnosis not present

## 2024-02-19 DIAGNOSIS — M5459 Other low back pain: Secondary | ICD-10-CM | POA: Diagnosis not present

## 2024-02-23 DIAGNOSIS — M5459 Other low back pain: Secondary | ICD-10-CM | POA: Diagnosis not present

## 2024-02-24 DIAGNOSIS — M5459 Other low back pain: Secondary | ICD-10-CM | POA: Diagnosis not present

## 2024-02-26 DIAGNOSIS — M5459 Other low back pain: Secondary | ICD-10-CM | POA: Diagnosis not present

## 2024-02-29 DIAGNOSIS — M5459 Other low back pain: Secondary | ICD-10-CM | POA: Diagnosis not present

## 2024-03-02 DIAGNOSIS — M5459 Other low back pain: Secondary | ICD-10-CM | POA: Diagnosis not present

## 2024-03-07 DIAGNOSIS — M5459 Other low back pain: Secondary | ICD-10-CM | POA: Diagnosis not present

## 2024-03-08 DIAGNOSIS — M48062 Spinal stenosis, lumbar region with neurogenic claudication: Secondary | ICD-10-CM | POA: Diagnosis not present

## 2024-03-11 DIAGNOSIS — M5459 Other low back pain: Secondary | ICD-10-CM | POA: Diagnosis not present

## 2024-03-21 DIAGNOSIS — M545 Low back pain, unspecified: Secondary | ICD-10-CM | POA: Diagnosis not present

## 2024-03-23 DIAGNOSIS — M5459 Other low back pain: Secondary | ICD-10-CM | POA: Diagnosis not present

## 2024-03-25 DIAGNOSIS — M5459 Other low back pain: Secondary | ICD-10-CM | POA: Diagnosis not present

## 2024-03-28 DIAGNOSIS — M5459 Other low back pain: Secondary | ICD-10-CM | POA: Diagnosis not present

## 2024-03-28 DIAGNOSIS — M48062 Spinal stenosis, lumbar region with neurogenic claudication: Secondary | ICD-10-CM | POA: Diagnosis not present

## 2024-04-12 DIAGNOSIS — R921 Mammographic calcification found on diagnostic imaging of breast: Secondary | ICD-10-CM | POA: Diagnosis not present

## 2024-04-12 DIAGNOSIS — R92333 Mammographic heterogeneous density, bilateral breasts: Secondary | ICD-10-CM | POA: Diagnosis not present

## 2024-04-21 DIAGNOSIS — G2581 Restless legs syndrome: Secondary | ICD-10-CM | POA: Diagnosis not present

## 2024-04-21 DIAGNOSIS — Z299 Encounter for prophylactic measures, unspecified: Secondary | ICD-10-CM | POA: Diagnosis not present

## 2024-04-21 DIAGNOSIS — I10 Essential (primary) hypertension: Secondary | ICD-10-CM | POA: Diagnosis not present

## 2024-05-03 DIAGNOSIS — M48062 Spinal stenosis, lumbar region with neurogenic claudication: Secondary | ICD-10-CM | POA: Diagnosis not present

## 2024-05-11 DIAGNOSIS — I10 Essential (primary) hypertension: Secondary | ICD-10-CM | POA: Diagnosis not present

## 2024-05-11 DIAGNOSIS — E7800 Pure hypercholesterolemia, unspecified: Secondary | ICD-10-CM | POA: Diagnosis not present

## 2024-05-11 DIAGNOSIS — Z0000 Encounter for general adult medical examination without abnormal findings: Secondary | ICD-10-CM | POA: Diagnosis not present

## 2024-05-11 DIAGNOSIS — Z299 Encounter for prophylactic measures, unspecified: Secondary | ICD-10-CM | POA: Diagnosis not present
